# Patient Record
Sex: Male | Born: 1990 | Hispanic: No | Marital: Single | Smoking: Current every day smoker
Health system: Southern US, Community
[De-identification: ages and names within clinical notes are randomized; demographics above are authoritative.]

## PROBLEM LIST (undated history)

## (undated) ENCOUNTER — Emergency Department (HOSPITAL_COMMUNITY): Discharge status: Absent without leave | Payer: Self-pay

---

## 2013-03-16 ENCOUNTER — Ambulatory Visit
Admission: RE | Admit: 2013-03-16 | Discharge: 2013-03-16 | Disposition: A | Discharge status: Home / Self Care | Payer: Medicaid Other | Source: Ambulatory Visit | Attending: Family Medicine | Admitting: Family Medicine

## 2013-03-16 ENCOUNTER — Encounter: Payer: Self-pay | Admitting: Family Medicine

## 2013-03-16 ENCOUNTER — Ambulatory Visit: Payer: Medicaid Other | Attending: Family Medicine | Admitting: Family Medicine

## 2013-03-16 VITALS — BP 97/62 | HR 68 | Temp 97.9°F | Resp 18 | Ht 70.0 in | Wt 198.0 lb

## 2013-03-16 DIAGNOSIS — F17200 Nicotine dependence, unspecified, uncomplicated: Secondary | ICD-10-CM

## 2013-03-16 DIAGNOSIS — M549 Dorsalgia, unspecified: Secondary | ICD-10-CM | POA: Insufficient documentation

## 2013-03-16 DIAGNOSIS — M545 Low back pain: Secondary | ICD-10-CM

## 2013-03-16 MED ORDER — KETOROLAC TROMETHAMINE 30 MG/ML IJ SOLN
30.0000 mg | Freq: Once | INTRAMUSCULAR | Status: AC
  Administered 2013-03-16: 30 mg via INTRAMUSCULAR

## 2013-03-16 MED ORDER — NAPROXEN 500 MG PO TABS: ORAL_TABLET | ORAL | Status: DC

## 2013-03-16 MED ORDER — KETOROLAC TROMETHAMINE 30 MG/ML IM SOLN: 30.0000 mg | Freq: Once | INTRAMUSCULAR | Status: DC

## 2013-03-16 MED ORDER — CYCLOBENZAPRINE HCL 5 MG PO TABS: 5.0000 mg | ORAL_TABLET | Freq: Three times a day (TID) | ORAL | Status: DC | PRN

## 2013-03-16 NOTE — Patient Instructions (Signed)
Back Exercises °Back exercises help treat and prevent back injuries. The goal of back exercises is to increase the strength of your abdominal and back muscles and the flexibility of your back. These exercises should be started when you no longer have back pain. Back exercises include: °· Pelvic Tilt. Lie on your back with your knees bent. Tilt your pelvis until the lower part of your back is against the floor. Hold this position 5 to 10 sec and repeat 5 to 10 times. °· Knee to Chest. Pull first 1 knee up against your chest and hold for 20 to 30 seconds, repeat this with the other knee, and then both knees. This may be done with the other leg straight or bent, whichever feels better. °· Sit-Ups or Curl-Ups. Bend your knees 90 degrees. Start with tilting your pelvis, and do a partial, slow sit-up, lifting your trunk only 30 to 45 degrees off the floor. Take at least 2 to 3 seconds for each sit-up. Do not do sit-ups with your knees out straight. If partial sit-ups are difficult, simply do the above but with only tightening your abdominal muscles and holding it as directed. °· Hip-Lift. Lie on your back with your knees flexed 90 degrees. Push down with your feet and shoulders as you raise your hips a couple inches off the floor; hold for 10 seconds, repeat 5 to 10 times. °· Back arches. Lie on your stomach, propping yourself up on bent elbows. Slowly press on your hands, causing an arch in your low back. Repeat 3 to 5 times. Any initial stiffness and discomfort should lessen with repetition over time. °· Shoulder-Lifts. Lie face down with arms beside your body. Keep hips and torso pressed to floor as you slowly lift your head and shoulders off the floor. °Do not overdo your exercises, especially in the beginning. Exercises may cause you some mild back discomfort which lasts for a few minutes; however, if the pain is more severe, or lasts for more than 15 minutes, do not continue exercises until you see your caregiver.  Improvement with exercise therapy for back problems is slow.  °See your caregivers for assistance with developing a proper back exercise program. °Document Released: 10/09/2004 Document Revised: 11/24/2011 Document Reviewed: 07/03/2011 °ExitCare® Patient Information ©2014 [employer]. ° °Back Injury Prevention °Back injuries can be extremely painful and difficult to heal. After having one back injury, you are much more likely to experience another later on. It is important to learn how to avoid injuring or re-injuring your back. The following tips can help you to prevent a back injury. °PHYSICAL FITNESS °· Exercise regularly and try to develop good tone in your abdominal muscles. Your abdominal muscles provide a lot of the support needed by your back. °· Do aerobic exercises (walking, jogging, biking, swimming) regularly. °· Do exercises that increase balance and strength (tai chi, yoga) regularly. This can decrease your risk of falling and injuring your back. °· Stretch before and after exercising. °· Maintain a healthy weight. The more you weigh, the more stress is placed on your back. For every pound of weight, 10 times that amount of pressure is placed on the back. °DIET °· Talk to your caregiver about how much calcium and vitamin D you need per day. These nutrients help to prevent weakening of the bones (osteoporosis). Osteoporosis can cause broken (fractured) bones that lead to back pain. °· Include good sources of calcium in your diet, such as dairy products, green, leafy vegetables, and products with   calcium added (fortified). °· Include good sources of vitamin D in your diet, such as milk and foods that are fortified with vitamin D. °· Consider taking a nutritional supplement or a multivitamin if needed. °· Stop smoking if you smoke. °POSTURE °· Sit and stand up straight. Avoid leaning forward when you sit or hunching over when you stand. °· Choose chairs with good low back (lumbar) support. °· If you  work at a desk, sit close to your work so you do not need to lean over. Keep your chin tucked in. Keep your neck drawn back and elbows bent at a right angle. Your arms should look like the letter "L." °· Sit high and close to the steering wheel when you drive. Add a lumbar support to your car seat if needed. °· Avoid sitting or standing in one position for too long. Take breaks to get up, stretch, and walk around at least once every hour. Take breaks if you are driving for long periods of time. °· Sleep on your side with your knees slightly bent, or sleep on your back with a pillow under your knees. Do not sleep on your stomach. °LIFTING, TWISTING, AND REACHING °· Avoid heavy lifting, especially repetitive lifting. If you must do heavy lifting: °· Stretch before lifting. °· Work slowly. °· Rest between lifts. °· Use carts and dollies to move objects when possible. °· Make several small trips instead of carrying 1 heavy load. °· Ask for help when you need it. °· Ask for help when moving big, awkward objects. °· Follow these steps when lifting: °· Stand with your feet shoulder-width apart. °· Get as close to the object as you can. Do not try to pick up heavy objects that are far from your body. °· Use handles or lifting straps if they are available. °· Bend at your knees. Squat down, but keep your heels off the floor. °· Keep your shoulders pulled back, your chin tucked in, and your back straight. °· Lift the object slowly, tightening the muscles in your legs, abdomen, and buttocks. Keep the object as close to the center of your body as possible. °· When you put a load down, use these same guidelines in reverse. °· Do not: °· Lift the object above your waist. °· Twist at the waist while lifting or carrying a load. Move your feet if you need to turn, not your waist. °· Bend over without bending at your knees. °· Avoid reaching over your head, across a table, or for an object on a high surface. °OTHER TIPS °· Avoid wet  floors and keep sidewalks clear of ice to prevent falls. °· Do not sleep on a mattress that is too soft or too hard. °· Keep items that are used frequently within easy reach. °· Put heavier objects on shelves at waist level and lighter objects on lower or higher shelves. °· Find ways to decrease your stress, such as exercise, massage, or relaxation techniques. Stress can build up in your muscles. Tense muscles are more vulnerable to injury. °· Seek treatment for depression or anxiety if needed. These conditions can increase your risk of developing back pain. °SEEK MEDICAL CARE IF: °· You injure your back. °· You have questions about diet, exercise, or other ways to prevent back injuries. °MAKE SURE YOU: °· Understand these instructions. °· Will watch your condition. °· Will get help right away if you are not doing well or get worse. °Document Released: 10/09/2004 Document Revised: 11/24/2011 Document Reviewed:   ExitCare Patient Information 2014 [place], [place]. Back Pain, Adult Low back pain is very common. About 1 in 5 people have back pain.The cause of low back pain is rarely dangerous. The pain often gets better over time.About half of people with a sudden onset of back pain feel better in just 2 weeks. About 8 in 10 people feel better by 6 weeks.  CAUSES Some common causes of back pain include:  Strain of the muscles or ligaments supporting the spine.  Wear and tear (degeneration) of the spinal discs.  Arthritis.  Direct injury to the back. DIAGNOSIS Most of the time, the direct cause of low back pain is not known.However, back pain can be treated effectively even when the exact cause of the pain is unknown.Answering your caregiver's questions about your overall health and symptoms is one of the most accurate ways to make sure the cause of your pain is not dangerous. If your caregiver needs more information, he or she may order lab work or imaging tests (X-rays or MRIs).However,  even if imaging tests show changes in your back, this usually does not require surgery. HOME CARE INSTRUCTIONS For many people, back pain returns.Since low back pain is rarely dangerous, it is often a condition that people can learn to [facility] their own.   Remain active. It is stressful on the back to sit or stand in one place. Do not sit, drive, or stand in one place for more than 30 minutes at a time. Take short walks on level surfaces as soon as pain allows.Try to increase the length of time you walk each day.  Do not stay in bed.Resting more than 1 or 2 days can delay your recovery.  Do not avoid exercise or work.Your body is made to move.It is not dangerous to be active, even though your back may hurt.Your back will likely heal faster if you return to being active before your pain is gone.  Pay attention to your body when you bend and lift. Many people have less discomfortwhen lifting if they bend their knees, keep the load close to their bodies,and avoid twisting. Often, the most comfortable positions are those that put less stress on your recovering back.  Find a comfortable position to sleep. Use a firm mattress and lie on your side with your knees slightly bent. If you lie on your back, put a pillow under your knees.  Only take over-the-counter or prescription medicines as directed by your caregiver. Over-the-counter medicines to reduce pain and inflammation are often the most helpful.Your caregiver may prescribe muscle relaxant drugs.These medicines help dull your pain so you can more quickly return to your normal activities and healthy exercise.  Put ice on the injured area.  Put ice in a plastic bag.  Place a towel between your skin and the bag.  Leave the ice on for 15-20 minutes, 3-4 times a day for the first 2 to 3 days. After that, ice and heat may be alternated to reduce pain and spasms.  Ask your caregiver about trying back exercises and gentle massage. This may  be of some benefit.  Avoid feeling anxious or stressed.Stress increases muscle tension and can worsen back pain.It is important to recognize when you are anxious or stressed and learn ways to manage it.Exercise is a great option. SEEK MEDICAL CARE IF:  You have pain that is not relieved with rest or medicine.  You have pain that does not improve in 1 week.  You have new symptoms.  You are generally not feeling well. SEEK IMMEDIATE MEDICAL CARE IF:   You have pain that radiates from your back into your legs.  You develop new bowel or bladder control problems.  You have unusual weakness or numbness in your arms or legs.  You develop nausea or vomiting.  You develop abdominal pain.  You feel faint. Document Released: 09/01/2005 Document Revised: 03/02/2012 Document Reviewed: 01/20/2011 [facility] Patient Information 2014 [place], [place].

## 2013-03-16 NOTE — Progress Notes (Signed)
03/16/13 Present  to clinic c/o sprain in back [name]

## 2013-03-16 NOTE — Progress Notes (Signed)
Quick Note:  Print production planner will be needed to communicate with patient. You can use of language line and asked them to call the patient directly and do a three-way conversation with the patient. Please inform patient that the results of his x-ray of the lumbar spine came back within normal limits. No abnormalities were found.   Rodney Langton, MD, CDE, FAAFP [facility] [place], [place]   ______

## 2013-03-16 NOTE — Progress Notes (Signed)
Patient ID: Kirk Bailey, male   DOB: 1991-07-21, 22 y.o.   MRN: [id number]  CC:  Back pain ARabic interpreter used to communicate with patient today HPI: Pt presenting as a new patient to clinic today.  He reports that he's been lifting 50 pounds repetitively at work for the past several weeks and reports that he has developed pain in the lumbar spine.  The pain has not radiated.  It stays in the lower spine and has not caused any significant problems other than 6/10 pain.  The patient reports that he has not taken any medications for this.  The patient reports that he does not take any medications at all.  He denies any drug allergies.  The patient denies any other neurological symptoms.  The patient reports that 2 years ago he had a similar incident where he had back pain very similar to this current episode. No loss of bowel or bladder control.   Allergies no known allergies History reviewed. No pertinent past medical history. No current outpatient prescriptions on file prior to visit.   No current facility-administered medications on file prior to visit.   History reviewed. No pertinent family history. History   Social History  . Marital Status: Single    Spouse Name: N/A    Number of Children: N/A  . Years of Education: N/A   Occupational History  . Not on file.   Social History Main Topics  . Smoking status: Current Every Day Smoker    Types: Cigarettes  . Smokeless tobacco: Not on file     Comment: 1/2 pack per day  . Alcohol Use: Not on file  . Drug Use: Not on file  . Sexually Active: Not on file   Other Topics Concern  . Not on file   Social History Narrative  . No narrative on file    Review of Systems  Constitutional: Negative for fever, chills, diaphoresis, activity change, appetite change and fatigue.  HENT: Negative for ear pain, nosebleeds, congestion, facial swelling, rhinorrhea, neck pain, neck stiffness and ear discharge.   Eyes: Negative for pain,  discharge, redness, itching and visual disturbance.  Respiratory: Negative for cough, choking, chest tightness, shortness of breath, wheezing and stridor.   Cardiovascular: Negative for chest pain, palpitations and leg swelling.  Gastrointestinal: Negative for abdominal distention.  Genitourinary: Negative for dysuria, urgency, frequency, hematuria, flank pain, decreased urine volume, difficulty urinating and dyspareunia.  Musculoskeletal: lumbar spine pain   Neurological: Negative for dizziness, tremors, seizures, syncope, facial asymmetry, speech difficulty, weakness, light-headedness, numbness and headaches.  Hematological: Negative for adenopathy. Does not bruise/bleed easily.  Psychiatric/Behavioral: Negative for hallucinations, behavioral problems, confusion, dysphoric mood, decreased concentration and agitation.    Objective:   Filed Vitals:   03/16/13 1125  BP: 97/62  Pulse: 68  Temp: 97.9 F (36.6 C)  Resp: 18    Physical Exam  Constitutional: Appears well-developed and well-nourished. No distress.  HENT: Normocephalic. External right and left ear normal. Oropharynx is clear and moist.  Eyes: Conjunctivae and EOM are normal. PERRLA, no scleral icterus.  Neck: Normal ROM. Neck supple. No JVD. No tracheal deviation. No thyromegaly.  CVS: RRR, S1/S2 +, no murmurs, no gallops, no carotid bruit.  Pulmonary: Effort and breath sounds normal, no stridor, rhonchi, wheezes, rales.  Abdominal: Soft. BS +,  no distension, tenderness, rebound or guarding.  Musculoskeletal: Spasm of the paraspinal muscles of the lumbar spine, negative straight leg raise test  Lymphadenopathy: No lymphadenopathy noted, cervical, inguinal. Neuro:  Alert. Normal reflexes, muscle tone coordination. No cranial nerve deficit. Skin: Skin is warm and dry. No rash noted. Not diaphoretic. No erythema. No pallor.  Psychiatric: Normal mood and affect. Behavior, judgment, thought content normal.   No results found  for this basename: WBC, HGB, HCT, MCV, PLT   No results found for this basename: CREATININE, BUN, NA, K, CL, CO2    No results found for this basename: HGBA1C   Lipid Panel  No results found for this basename: chol, trig, hdl, cholhdl, vldl, ldlcalc     Assessment and plan:   Patient Active Problem List   Diagnosis Date Noted  . Acute low back pain 03/16/2013  . Smoker 03/16/2013   Send the patient for xray of lumbar spine toradol injection given in office. Rx for cyclobenzaprin 5 mg po tid prn spasms and for naproxen 500 mg po every 12 hours prn No lifting over 15 pounds for next week  The patient was counseled on the dangers of tobacco use, and was advised to quit.  Reviewed strategies to maximize success, including removing cigarettes and smoking materials from environment and stress management.  Follow up in 3 weeks for recheck  The patient was given clear instructions to go to ER or return to medical center if symptoms don't improve, worsen or new problems develop.  The patient verbalized understanding.  The patient was told to call to get any lab results if not heard anything in the next week.    Rodney Langton, MD, CDE, FAAFP [facility] [place], [place]

## 2013-04-06 ENCOUNTER — Ambulatory Visit: Payer: Medicaid Other | Attending: Family Medicine | Admitting: Family Medicine

## 2013-04-06 VITALS — BP 119/75 | HR 74 | Temp 99.0°F | Resp 16 | Wt 201.0 lb

## 2013-04-06 DIAGNOSIS — F17200 Nicotine dependence, unspecified, uncomplicated: Secondary | ICD-10-CM | POA: Insufficient documentation

## 2013-04-06 DIAGNOSIS — M545 Low back pain, unspecified: Secondary | ICD-10-CM | POA: Insufficient documentation

## 2013-04-06 MED ORDER — NAPROXEN 500 MG PO TABS: ORAL_TABLET | ORAL | Status: AC

## 2013-04-06 MED ORDER — CYCLOBENZAPRINE HCL 5 MG PO TABS: 5.0000 mg | ORAL_TABLET | Freq: Every evening | ORAL | Status: AC | PRN

## 2013-04-06 NOTE — Progress Notes (Signed)
Patient presents for follow up of back pain; states pain is better but pain gets very bad when sitting or standing for long periods.

## 2013-04-06 NOTE — Progress Notes (Signed)
CC: follow up   Interpreter used to speak with patient today  HPI: Pt is reporting that he is feeling better.  His back is not hurting like it had in the past. The flexeril is making him sleepy. He only has pain with sitting for prolonged periods of time.     No Known Allergies History reviewed. No pertinent past medical history. Current Outpatient Prescriptions on File Prior to Visit  Medication Sig Dispense Refill  . cyclobenzaprine (FLEXERIL) 5 MG tablet Take 1 tablet (5 mg total) by mouth 3 (three) times daily as needed for muscle spasms (Caution May Cause Drowsiness).  30 tablet  1  . naproxen (NAPROSYN) 500 MG tablet Arabic Instructions Please :  Take 1 tablet with food every 12 hours PRN back pain  20 tablet  0   No current facility-administered medications on file prior to visit.   History reviewed. No pertinent family history. History   Social History  . Marital Status: Single    Spouse Name: N/A    Number of Children: N/A  . Years of Education: N/A   Occupational History  . Not on file.   Social History Main Topics  . Smoking status: Current Every Day Smoker    Types: Cigarettes  . Smokeless tobacco: Not on file     Comment: 1/2 pack per day  . Alcohol Use: No  . Drug Use: No  . Sexually Active: Not on file   Other Topics Concern  . Not on file   Social History Narrative  . No narrative on file    Review of Systems  Constitutional: Negative for fever, chills, diaphoresis, activity change, appetite change and fatigue.  HENT: Negative for ear pain, nosebleeds, congestion, facial swelling, rhinorrhea, neck pain, neck stiffness and ear discharge.   Eyes: Negative for pain, discharge, redness, itching and visual disturbance.  Respiratory: Negative for cough, choking, chest tightness, shortness of breath, wheezing and stridor.   Cardiovascular: Negative for chest pain, palpitations and leg swelling.  Gastrointestinal: Negative for abdominal distention.   Genitourinary: Negative for dysuria, urgency, frequency, hematuria, flank pain, decreased urine volume, difficulty urinating and dyspareunia.  Musculoskeletal: improved back pain, Negative for joint swelling, arthralgias and gait problem.  Neurological: Negative for dizziness, tremors, seizures, syncope, facial asymmetry, speech difficulty, weakness, light-headedness, numbness and headaches.  Hematological: Negative for adenopathy. Does not bruise/bleed easily.  Psychiatric/Behavioral: Negative for hallucinations, behavioral problems, confusion, dysphoric mood, decreased concentration and agitation.    Objective:   Filed Vitals:   04/06/13 1054  BP: 119/75  Pulse: 74  Temp: 99 F (37.2 C)  Resp: 16    Physical Exam  Constitutional: Appears well-developed and well-nourished. No distress.  HENT: Normocephalic. External right and left ear normal. Oropharynx is clear and moist.  Eyes: Conjunctivae and EOM are normal. PERRLA, no scleral icterus.  Neck: Normal ROM. Neck supple. No JVD. No tracheal deviation. No thyromegaly.  CVS: RRR, S1/S2 +, no murmurs, no gallops, no carotid bruit.  Pulmonary: Effort and breath sounds normal, no stridor, rhonchi, wheezes, rales.  Abdominal: Soft. BS +,  no distension, tenderness, rebound or guarding.  Musculoskeletal: Normal range of motion. No edema and no tenderness.  Lymphadenopathy: No lymphadenopathy noted, cervical, inguinal. Neuro: Alert. Normal reflexes, muscle tone coordination. No cranial nerve deficit. Skin: Skin is warm and dry. No rash noted. Not diaphoretic. No erythema. No pallor.  Psychiatric: Normal mood and affect. Behavior, judgment, thought content normal.   No results found for this basename: WBC, HGB, HCT,  MCV, PLT   No results found for this basename: CREATININE, BUN, NA, K, CL, CO2    No results found for this basename: HGBA1C   Lipid Panel  No results found for this basename: chol, trig, hdl, cholhdl, vldl, ldlcalc        Assessment and plan:   Patient Active Problem List   Diagnosis Date Noted  . Acute low back pain 03/16/2013  . Smoker 03/16/2013   Pt says that pain is much better.  Pt is tolerating naproxen as needed for pain.   The patient was counseled on the dangers of tobacco use, and was advised to quit.  Reviewed strategies to maximize success, including removing cigarettes and smoking materials from environment.  Pt advised to use a lumbar support as needed when sitting for prolonged times  OK to return to work  Refill for naproxen to use as needed  The patient was given clear instructions to go to ER or return to medical center if symptoms don't improve, worsen or new problems develop.  The patient verbalized understanding.  The patient was told to call to get any lab results if not heard anything in the next week.    RTC in 6 months  Rodney Langton, MD, CDE, FAAFP [facility] [address] [place], [place]

## 2013-04-06 NOTE — Patient Instructions (Addendum)
Back Injury Prevention Back injuries can be extremely painful and difficult to heal. After having one back injury, you are much more likely to experience another later on. It is important to learn how to avoid injuring or re-injuring your back. The following tips can help you to prevent a back injury. PHYSICAL FITNESS  Exercise regularly and try to develop good tone in your abdominal muscles. Your abdominal muscles provide a lot of the support needed by your back.  Do aerobic exercises (walking, jogging, biking, swimming) regularly.  Do exercises that increase balance and strength (tai chi, yoga) regularly. This can decrease your risk of falling and injuring your back.  Stretch before and after exercising.  Maintain a healthy weight. The more you weigh, the more stress is placed on your back. For every pound of weight, 10 times that amount of pressure is placed on the back. DIET  Talk to your caregiver about how much calcium and vitamin D you need per day. These nutrients help to prevent weakening of the bones (osteoporosis). Osteoporosis can cause broken (fractured) bones that lead to back pain.  Include good sources of calcium in your diet, such as dairy products, green, leafy vegetables, and products with calcium added (fortified).  Include good sources of vitamin D in your diet, such as milk and foods that are fortified with vitamin D.  Consider taking a nutritional supplement or a multivitamin if needed.  Stop smoking if you smoke. POSTURE  Sit and stand up straight. Avoid leaning forward when you sit or hunching over when you stand.  Choose chairs with good low back (lumbar) support.  If you work at a desk, sit close to your work so you do not need to lean over. Keep your chin tucked in. Keep your neck drawn back and elbows bent at a right angle. Your arms should look like the letter "L."  Sit high and close to the steering wheel when you drive. Add a lumbar support to your car  seat if needed.  Avoid sitting or standing in one position for too long. Take breaks to get up, stretch, and walk around at least once every hour. Take breaks if you are driving for long periods of time.  Sleep on your side with your knees slightly bent, or sleep on your back with a pillow under your knees. Do not sleep on your stomach. LIFTING, TWISTING, AND REACHING  Avoid heavy lifting, especially repetitive lifting. If you must do heavy lifting:  Stretch before lifting.  Work slowly.  Rest between lifts.  Use carts and dollies to move objects when possible.  Make several small trips instead of carrying 1 heavy load.  Ask for help when you need it.  Ask for help when moving big, awkward objects.  Follow these steps when lifting:  Stand with your feet shoulder-width apart.  Get as close to the object as you can. Do not try to pick up heavy objects that are far from your body.  Use handles or lifting straps if they are available.  Bend at your knees. Squat down, but keep your heels off the floor.  Keep your shoulders pulled back, your chin tucked in, and your back straight.  Lift the object slowly, tightening the muscles in your legs, abdomen, and buttocks. Keep the object as close to the center of your body as possible.  When you put a load down, use these same guidelines in reverse.  Do not:  Lift the object above your waist.    Twist at the waist while lifting or carrying a load. Move your feet if you need to turn, not your waist.  Bend over without bending at your knees.  Avoid reaching over your head, across a table, or for an object on a high surface. OTHER TIPS  Avoid wet floors and keep sidewalks clear of ice to prevent falls.  Do not sleep on a mattress that is too soft or too hard.  Keep items that are used frequently within easy reach.  Put heavier objects on shelves at waist level and lighter objects on lower or higher shelves.  Find ways to  decrease your stress, such as exercise, massage, or relaxation techniques. Stress can build up in your muscles. Tense muscles are more vulnerable to injury.  Seek treatment for depression or anxiety if needed. These conditions can increase your risk of developing back pain. SEEK MEDICAL CARE IF:  You injure your back.  You have questions about diet, exercise, or other ways to prevent back injuries. MAKE SURE YOU:  Understand these instructions.  Will watch your condition.  Will get help right away if you are not doing well or get worse. Document Released: 10/09/2004 Document Revised: 11/24/2011 Document Reviewed: 10/13/2011 [facility] Patient Information 2014 [place], [place].  Smoking Cessation, Tips for Success YOU CAN QUIT SMOKING If you are ready to quit smoking, congratulations! You have chosen to help yourself be healthier. Cigarettes bring nicotine, tar, carbon monoxide, and other irritants into your body. Your lungs, heart, and blood vessels will be able to work better without these poisons. There are many different ways to quit smoking. Nicotine gum, nicotine patches, a nicotine inhaler, or nicotine nasal spray can help with physical craving. Hypnosis, support groups, and medicines help break the habit of smoking. Here are some tips to help you quit for good.  Throw away all cigarettes.  Clean and remove all ashtrays from your home, work, and car.  On a card, write down your reasons for quitting. Carry the card with you and read it when you get the urge to smoke.  Cleanse your body of nicotine. Drink enough water and fluids to keep your urine clear or pale yellow. Do this after quitting to flush the nicotine from your body.  Learn to predict your moods. Do not let a bad situation be your excuse to have a cigarette. Some situations in your life might tempt you into wanting a cigarette.  Never have "just one" cigarette. It leads to wanting another and another. Remind yourself of  your decision to quit.  Change habits associated with smoking. If you smoked while driving or when feeling stressed, try other activities to replace smoking. Stand up when drinking your coffee. Brush your teeth after eating. Sit in a different chair when you read the paper. Avoid alcohol while trying to quit, and try to drink fewer caffeinated beverages. Alcohol and caffeine may urge you to smoke.  Avoid foods and drinks that can trigger a desire to smoke, such as sugary or spicy foods and alcohol.  Ask people who smoke not to smoke around you.  Have something planned to do right after eating or having a cup of coffee. Take a walk or exercise to perk you up. This will help to keep you from overeating.  Try a relaxation exercise to calm you down and decrease your stress. Remember, you may be tense and nervous for the first 2 weeks after you quit, but this will pass.  Find new activities to keep  your hands busy. Play with a pen, coin, or rubber band. Doodle or draw things on paper.  Brush your teeth right after eating. This will help cut down on the craving for the taste of tobacco after meals. You can try mouthwash, too.  Use oral substitutes, such as lemon drops, carrots, a cinnamon stick, or chewing gum, in place of cigarettes. Keep them handy so they are available when you have the urge to smoke.  When you have the urge to smoke, try deep breathing.  Designate your home as a nonsmoking area.  If you are a heavy smoker, ask your caregiver about a prescription for nicotine chewing gum. It can ease your withdrawal from nicotine.  Reward yourself. Set aside the cigarette money you save and buy yourself something nice.  Look for support from others. Join a support group or smoking cessation program. Ask someone at home or at work to help you with your plan to quit smoking.  Always ask yourself, "Do I need this cigarette or is this just a reflex?" Tell yourself, "Today, I choose not to  smoke," or "I do not want to smoke." You are reminding yourself of your decision to quit, even if you do smoke a cigarette. HOW WILL I FEEL WHEN I QUIT SMOKING?  The benefits of not smoking start within days of quitting.  You may have symptoms of withdrawal because your body is used to nicotine (the addictive substance in cigarettes). You may crave cigarettes, be irritable, feel very hungry, cough often, get headaches, or have difficulty concentrating.  The withdrawal symptoms are only temporary. They are strongest when you first quit but will go away within 10 to 14 days.  When withdrawal symptoms occur, stay in control. Think about your reasons for quitting. Remind yourself that these are signs that your body is healing and getting used to being without cigarettes.  Remember that withdrawal symptoms are easier to treat than the major diseases that smoking can cause.  Even after the withdrawal is over, expect periodic urges to smoke. However, these cravings are generally short-lived and will go away whether you smoke or not. Do not smoke!  If you relapse and smoke again, do not lose hope. Most smokers quit 3 times before they are successful.  If you relapse, do not give up! Plan ahead and think about what you will do the next time you get the urge to smoke. LIFE AS A NONSMOKER: MAKE IT FOR A MONTH, MAKE IT FOR LIFE Day 1: Hang this page where you will see it every day. Day 2: Get rid of all ashtrays, matches, and lighters. Day 3: Drink water. Breathe deeply between sips. Day 4: Avoid places with smoke-filled air, such as bars, clubs, or the smoking section of restaurants. Day 5: Keep track of how much money you save by not smoking. Day 6: Avoid boredom. Keep a good book with you or go to the movies. Day 7: Reward yourself! One week without smoking! Day 8: Make a dental appointment to get your teeth cleaned. Day 9: Decide how you will turn down a cigarette before it is offered to you. Day  10: Review your reasons for quitting. Day 11: Distract yourself. Stay active to keep your mind off smoking and to relieve tension. Take a walk, exercise, read a book, do a crossword puzzle, or try a new hobby. Day 12: Exercise. Get off the bus before your stop or use stairs instead of escalators. Day 13: Call on friends for  support and encouragement. Day 14: Reward yourself! Two weeks without smoking! Day 15: Practice deep breathing exercises. Day 16: Bet a friend that you can stay a nonsmoker. Day 17: Ask to sit in nonsmoking sections of restaurants. Day 18: Hang up "No Smoking" signs. Day 19: Think of yourself as a nonsmoker. Day 20: Each morning, tell yourself you will not smoke. Day 21: Reward yourself! Three weeks without smoking! Day 22: Think of smoking in negative ways. Remember how it stains your teeth, gives you bad breath, and leaves you short of breath. Day 23: Eat a nutritious breakfast. Day 24:Do not relive your days as a smoker. Day 25: Hold a pencil in your hand when talking on the telephone. Day 26: Tell all your friends you do not smoke. Day 27: Think about how much better food tastes. Day 28: Remember, one cigarette is one too many. Day 29: Take up a hobby that will keep your hands busy. Day 30: Congratulations! One month without smoking! Give yourself a big reward. Your caregiver can direct you to community resources or hospitals for support, which may include:  Group support.  Education.  Hypnosis.  Subliminal therapy. Document Released: 05/30/2004 Document Revised: 11/24/2011 Document Reviewed: 06/18/2009 [facility] Patient Information 2014 [place], [place].

## 2013-07-01 ENCOUNTER — Ambulatory Visit: Payer: Medicaid Other

## 2013-07-12 ENCOUNTER — Ambulatory Visit: Payer: Medicaid Other

## 2013-07-14 ENCOUNTER — Ambulatory Visit (HOSPITAL_COMMUNITY)
Admission: RE | Admit: 2013-07-14 | Discharge: 2013-07-14 | Disposition: A | Discharge status: Home / Self Care | Payer: Medicaid Other | Source: Ambulatory Visit | Attending: Internal Medicine | Admitting: Internal Medicine

## 2013-07-14 ENCOUNTER — Ambulatory Visit: Payer: Medicaid Other | Attending: Internal Medicine | Admitting: Internal Medicine

## 2013-07-14 DIAGNOSIS — M25439 Effusion, unspecified wrist: Secondary | ICD-10-CM | POA: Insufficient documentation

## 2013-07-14 DIAGNOSIS — M25532 Pain in left wrist: Secondary | ICD-10-CM

## 2013-07-14 DIAGNOSIS — M25539 Pain in unspecified wrist: Secondary | ICD-10-CM | POA: Insufficient documentation

## 2013-07-14 MED ORDER — UNABLE TO FIND: Status: AC

## 2013-07-14 NOTE — Progress Notes (Unsigned)
Patient ID: Kirk Bailey, male   DOB: [birth year], 22 y.o.   MRN: [id number] Patient Demographics  Kirk Bailey, is a 22 y.o. male  WUJ:[id number]  NFA:[id number]  DOB - March 29, 1991  No chief complaint on file.       Subjective:   Kirk Bailey today is here for a follow up visit. [name] here with interpreter. Patient reports that he came to [place] 7 months ago. He had  Left hand injury when he fell onto his left side while playing soccer in [place]. Since then it has been hurting especially on lifting weight. 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 Patient has No headache, No chest pain, No abdominal pain - No Nausea, No new weakness tingling or numbness, No Cough - SOB.  Objective:    There were no vitals filed for this visit.   ALLERGIES:  No Known Allergies  PAST MEDICAL HISTORY: No past medical history on file.  MEDICATIONS AT HOME: Prior to Admission medications   Medication Sig Start Date End Date Taking? Authorizing Provider  cyclobenzaprine (FLEXERIL) 5 MG tablet Take 1 tablet (5 mg total) by mouth at bedtime as needed for muscle spasms (Caution May Cause Drowsiness). 04/06/13   Clanford Cyndie Mull, MD  naproxen (NAPROSYN) 500 MG tablet Arabic Instructions Please :  Take 1 tablet with food every 12 hours PRN back pain 04/06/13   Clanford Cyndie Mull, MD  UNABLE TO FIND LEFT WRIST SPLINT 07/14/13   Ripudeep Jenna Luo, MD     Exam  General appearance :Awake, alert, NAD, Speech Clear.  HEENT: Atraumatic and Normocephalic, PERLA Neck: supple, no JVD. No cervical lymphadenopathy.  Chest: Clear to auscultation bilaterally, no wheezing, rales or rhonchi CVS: S1 S2 regular, no murmurs.  Abdomen: soft, NBS, NT, ND, no gaurding, rigidity or rebound. Extremities: no cyanosis or clubbing, B/L Lower Ext shows no edema Left wrist examined: Strength 5/ 5, 2 wrist bones protruding but no  tenderness, ROM normal   Neurology: Awake alert, and oriented X 3, CN II-XII intact, Non focal Skin: No Rash or lesions Wounds:N/A    Data Review   Basic Metabolic Panel: No results found for this basename: NA, K, CL, CO2, GLUCOSE, BUN, CREATININE, CALCIUM, MG, PHOS,  in the last 168 hours Liver Function Tests: No results found for this basename: AST, ALT, ALKPHOS, BILITOT, PROT, ALBUMIN,  in the last 168 hours  CBC: No results found for this basename: WBC, NEUTROABS, HGB, HCT, MCV, PLT,  in the last 168 hours  ------------------------------------------------------------------------------------------------------------------ No results found for this basename: HGBA1C,  in the last 72 hours ------------------------------------------------------------------------------------------------------------------ No results found for this basename: CHOL, HDL, LDLCALC, TRIG, CHOLHDL, LDLDIRECT,  in the last 72 hours ------------------------------------------------------------------------------------------------------------------ No results found for this basename: TSH, T4TOTAL, FREET3, T3FREE, THYROIDAB,  in the last 72 hours ------------------------------------------------------------------------------------------------------------------ No results found for this basename: VITAMINB12, FOLATE, FERRITIN, TIBC, IRON, RETICCTPCT,  in the last 72 hours  Coagulation profile  No results found for this basename: INR, PROTIME,  in the last 168 hours    Assessment & Plan   Active Problems: 1. Left wrist pain/ discomfort - Complete Left wrist Xray, pending results, may need orthopedic referral - does not want any pain medications - recommended patient to try a wrist splint.   health screening -Patient declined flu shot  Follow-up in1 month after the wrist x-ray     RAI,RIPUDEEP M.D. 07/14/2013, 3:27  PM   

## 2013-08-18 ENCOUNTER — Encounter: Payer: Self-pay | Admitting: Internal Medicine

## 2013-08-18 ENCOUNTER — Ambulatory Visit: Payer: Self-pay | Attending: Internal Medicine | Admitting: Internal Medicine

## 2013-08-18 VITALS — BP 124/82 | HR 87 | Temp 99.4°F | Resp 16 | Ht 70.0 in | Wt 206.0 lb

## 2013-08-18 DIAGNOSIS — M67439 Ganglion, unspecified wrist: Secondary | ICD-10-CM | POA: Insufficient documentation

## 2013-08-18 DIAGNOSIS — M6740 Ganglion, unspecified site: Secondary | ICD-10-CM | POA: Insufficient documentation

## 2013-08-18 DIAGNOSIS — M67432 Ganglion, left wrist: Secondary | ICD-10-CM

## 2013-08-18 MED ORDER — DICLOFENAC SODIUM 1 % TD GEL: 2.0000 g | Freq: Four times a day (QID) | TRANSDERMAL | Status: AC

## 2013-08-18 NOTE — Patient Instructions (Signed)
Ganglion Cyst °A ganglion cyst is a noncancerous, fluid-filled lump that occurs near joints or tendons. The ganglion cyst grows out of a joint or the lining of a tendon. It most often develops in the hand or wrist but can also develop in the shoulder, elbow, hip, knee, ankle, or foot. The round or oval ganglion can be pea sized or larger than a grape. Increased activity may enlarge the size of the cyst because more fluid starts to build up.  °CAUSES  °It is not completely known what causes a ganglion cyst to grow. However, it may be related to: °· Inflammation or irritation around the joint. °· An injury. °· Repetitive movements or overuse. °· Arthritis. °SYMPTOMS  °A lump most often appears in the hand or wrist, but can occur in other areas of the body. Generally, the lump is painless without other symptoms. However, sometimes pain can be felt during activity or when pressure is applied to the lump. The lump may even be tender to the touch. Tingling, pain, numbness, or muscle weakness can occur if the ganglion cyst presses on a nerve. Your grip may be weak and you may have less movement in your joints.  °DIAGNOSIS  °Ganglion cysts are most often diagnosed based on a physical exam, noting where the cyst is and how it looks. Your caregiver will feel the lump and may shine a light alongside it. If it is a ganglion, a light often shines through it. Your caregiver may order an X-ray, ultrasound, or MRI to rule out other conditions. °TREATMENT  °Ganglions usually go away on their own without treatment. If pain or other symptoms are involved, treatment may be needed. Treatment is also needed if the ganglion limits your movement or if it gets infected. Treatment options include: °· Wearing a wrist or finger brace or splint. °· Taking anti-inflammatory medicine. °· Draining fluid from the lump with a needle (aspiration). °· Injecting a steroid into the joint. °· Surgery to remove the ganglion cyst and its stalk that is  attached to the joint or tendon. However, ganglion cysts can grow back. °HOME CARE INSTRUCTIONS  °· Do not press on the ganglion, poke it with a needle, or hit it with a heavy object. You may rub the lump gently and often. Sometimes fluid moves out of the cyst. °· Only take medicines as directed by your caregiver. °· Wear your brace or splint as directed by your caregiver. °SEEK MEDICAL CARE IF:  °· Your ganglion becomes larger or more painful. °· You have increased redness, red streaks, or swelling. °· You have pus coming from the lump. °· You have weakness or numbness in the affected area. °MAKE SURE YOU:  °· Understand these instructions. °· Will watch your condition. °· Will get help right away if you are not doing well or get worse. °Document Released: 08/29/2000 Document Revised: 05/26/2012 Document Reviewed: 10/26/2007 °[employer]® Patient Information ©2014 [employer]. ° °

## 2013-08-18 NOTE — Progress Notes (Signed)
Patient ID: Kirk Bailey, male   DOB: 01/17/1991, 22 y.o.   MRN: [id number] Patient Demographics  Daichi Moris, is a 22 y.o. male  WUJ:[id number]  NFA:[id number]  DOB - [birth year]  Chief Complaint  Patient presents with  . Follow-up        Subjective:   Kirk Bailey is a 22 y.o. male here today for a follow up visit. Patient still having pain of the left wrist, note is that the bump is more prominent, hurts more when he drives. No redness, no fever, no diffuse joint swelling. Patient smokes cigarettes about 1 pack per day. He does not drink alcohol Patient has No headache, No chest pain, No abdominal pain - No Nausea, No new weakness tingling or numbness, No Cough - SOB.  ALLERGIES: No Known Allergies  PAST MEDICAL HISTORY: History reviewed. No pertinent past medical history.  MEDICATIONS AT HOME: Prior to Admission medications   Medication Sig Start Date End Date Taking? Authorizing Provider  cyclobenzaprine (FLEXERIL) 5 MG tablet Take 1 tablet (5 mg total) by mouth at bedtime as needed for muscle spasms (Caution May Cause Drowsiness). 04/06/13   Clanford Cyndie Mull, MD  diclofenac sodium (VOLTAREN) 1 % GEL Apply 2 g topically 4 (four) times daily. 08/18/13   Jeanann Lewandowsky, MD  naproxen (NAPROSYN) 500 MG tablet Arabic Instructions Please :  Take 1 tablet with food every 12 hours PRN back pain 04/06/13   Clanford Cyndie Mull, MD  UNABLE TO FIND LEFT WRIST SPLINT 07/14/13   Ripudeep Jenna Luo, MD     Objective:   Filed Vitals:   08/18/13 1426  BP: 124/82  Pulse: 87  Temp: 99.4 F (37.4 C)  TempSrc: Oral  Resp: 16  Height: 5\' 10"  (1.778 m)  Weight: 206 lb (93.441 kg)  SpO2: 98%    Exam General appearance : Awake, alert, not in any distress. Speech Clear. Not toxic looking HEENT: Atraumatic and Normocephalic, pupils equally reactive to light and accomodation Neck: supple, no JVD. No cervical lymphadenopathy.  Chest:Good air entry bilaterally, no added sounds   CVS: S1 S2 regular, no murmurs.  Abdomen: Bowel sounds present, Non tender and not distended with no gaurding, rigidity or rebound. Extremities: Ganglion cyst of left wrist joint. B/L Lower Ext shows no edema, both legs are warm to touch Neurology: Awake alert, and oriented X 3, CN II-XII intact, Non focal Skin:No Rash Wounds:N/A   Data Review   CBC No results found for this basename: WBC, HGB, HCT, PLT, MCV, MCH, MCHC, RDW, NEUTRABS, LYMPHSABS, MONOABS, EOSABS, BASOSABS, BANDABS, BANDSABD,  in the last 168 hours  Chemistries   No results found for this basename: NA, K, CL, CO2, GLUCOSE, BUN, CREATININE, GFRCGP, CALCIUM, MG, AST, ALT, ALKPHOS, BILITOT,  in the last 168 hours ------------------------------------------------------------------------------------------------------------------ No results found for this basename: HGBA1C,  in the last 72 hours ------------------------------------------------------------------------------------------------------------------ No results found for this basename: CHOL, HDL, LDLCALC, TRIG, CHOLHDL, LDLDIRECT,  in the last 72 hours ------------------------------------------------------------------------------------------------------------------ No results found for this basename: TSH, T4TOTAL, FREET3, T3FREE, THYROIDAB,  in the last 72 hours ------------------------------------------------------------------------------------------------------------------ No results found for this basename: VITAMINB12, FOLATE, FERRITIN, TIBC, IRON, RETICCTPCT,  in the last 72 hours  Coagulation profile  No results found for this basename: INR, PROTIME,  in the last 168 hours    Assessment & Plan   1. Ganglion cyst of wrist, left Conservative management Vs surgical excision Patient was counseled extensively about options available. He chose to go with conservative management for now -  diclofenac sodium (VOLTAREN) 1 % GEL; Apply 2 g topically 4 (four) times  daily.  Dispense: 1 Tube; Refill: 2  Follow up in 6 months or when necessary   The patient was given clear instructions to go to ER or return to medical center if symptoms don't improve, worsen or new problems develop. The patient verbalized understanding. The patient was told to call to get lab results if they haven't heard anything in the next week.    Jeanann Lewandowsky, MD, MHA, FACP, FAAP [facility] [place], [place] [phone]   08/18/2013, 2:46 PM

## 2013-08-18 NOTE — Progress Notes (Signed)
Pt is here following up in his left wrist pain. Pt had x rays taking of his wrist and would like to review the results

## 2013-08-30 ENCOUNTER — Ambulatory Visit: Payer: Self-pay | Attending: Internal Medicine

## 2013-10-06 ENCOUNTER — Ambulatory Visit: Payer: Medicaid Other

## 2014-03-06 ENCOUNTER — Ambulatory Visit: Payer: Medicaid Other

## 2021-12-31 ENCOUNTER — Emergency Department (HOSPITAL_BASED_OUTPATIENT_CLINIC_OR_DEPARTMENT_OTHER): Payer: Medicaid Other

## 2021-12-31 ENCOUNTER — Other Ambulatory Visit: Payer: Self-pay | Admitting: General Surgery

## 2021-12-31 ENCOUNTER — Ambulatory Visit (HOSPITAL_BASED_OUTPATIENT_CLINIC_OR_DEPARTMENT_OTHER)
Admission: EM | Admit: 2021-12-31 | Discharge: 2022-01-01 | Disposition: A | Discharge status: Home / Self Care | Payer: Medicaid Other | Attending: General Surgery | Admitting: General Surgery

## 2021-12-31 ENCOUNTER — Other Ambulatory Visit: Payer: Self-pay

## 2021-12-31 ENCOUNTER — Encounter (HOSPITAL_BASED_OUTPATIENT_CLINIC_OR_DEPARTMENT_OTHER): Payer: Self-pay | Admitting: Emergency Medicine

## 2021-12-31 DIAGNOSIS — K37 Unspecified appendicitis: Secondary | ICD-10-CM | POA: Diagnosis present

## 2021-12-31 DIAGNOSIS — K3580 Unspecified acute appendicitis: Secondary | ICD-10-CM | POA: Diagnosis not present

## 2021-12-31 DIAGNOSIS — K353 Acute appendicitis with localized peritonitis, without perforation or gangrene: Secondary | ICD-10-CM

## 2021-12-31 DIAGNOSIS — F17200 Nicotine dependence, unspecified, uncomplicated: Secondary | ICD-10-CM | POA: Diagnosis not present

## 2021-12-31 DIAGNOSIS — Z789 Other specified health status: Secondary | ICD-10-CM

## 2021-12-31 LAB — URINALYSIS, ROUTINE W REFLEX MICROSCOPIC
Bilirubin Urine: NEGATIVE
Glucose, UA: NEGATIVE mg/dL
Ketones, ur: NEGATIVE mg/dL
Leukocytes,Ua: NEGATIVE
Nitrite: NEGATIVE
Protein, ur: NEGATIVE mg/dL
Specific Gravity, Urine: 1.025 (ref 1.005–1.030)
pH: 6 (ref 5.0–8.0)

## 2021-12-31 LAB — CBC WITH DIFFERENTIAL/PLATELET
Abs Immature Granulocytes: 0.13 10*3/uL — ABNORMAL HIGH (ref 0.00–0.07)
Basophils Absolute: 0.1 10*3/uL (ref 0.0–0.1)
Basophils Relative: 0 %
Eosinophils Absolute: 0.1 10*3/uL (ref 0.0–0.5)
Eosinophils Relative: 0 %
HCT: 39.7 % (ref 39.0–52.0)
Hemoglobin: 13.8 g/dL (ref 13.0–17.0)
Immature Granulocytes: 1 %
Lymphocytes Relative: 10 %
Lymphs Abs: 2.1 10*3/uL (ref 0.7–4.0)
MCH: 30.1 pg (ref 26.0–34.0)
MCHC: 34.8 g/dL (ref 30.0–36.0)
MCV: 86.7 fL (ref 80.0–100.0)
Monocytes Absolute: 0.9 10*3/uL (ref 0.1–1.0)
Monocytes Relative: 4 %
Neutro Abs: 17.4 10*3/uL — ABNORMAL HIGH (ref 1.7–7.7)
Neutrophils Relative %: 85 %
Platelets: 264 10*3/uL (ref 150–400)
RBC: 4.58 MIL/uL (ref 4.22–5.81)
RDW: 13 % (ref 11.5–15.5)
WBC: 20.7 10*3/uL — ABNORMAL HIGH (ref 4.0–10.5)
nRBC: 0 % (ref 0.0–0.2)

## 2021-12-31 LAB — URINALYSIS, MICROSCOPIC (REFLEX)

## 2021-12-31 LAB — COMPREHENSIVE METABOLIC PANEL
ALT: 17 U/L (ref 0–44)
AST: 16 U/L (ref 15–41)
Albumin: 4.3 g/dL (ref 3.5–5.0)
Alkaline Phosphatase: 58 U/L (ref 38–126)
Anion gap: 8 (ref 5–15)
BUN: 13 mg/dL (ref 6–20)
CO2: 21 mmol/L — ABNORMAL LOW (ref 22–32)
Calcium: 9.2 mg/dL (ref 8.9–10.3)
Chloride: 109 mmol/L (ref 98–111)
Creatinine, Ser: 1.07 mg/dL (ref 0.61–1.24)
GFR, Estimated: >60 mL/min (ref >60)
Glucose, Bld: 112 mg/dL — ABNORMAL HIGH (ref 70–99)
Potassium: 3.7 mmol/L (ref 3.5–5.1)
Sodium: 138 mmol/L (ref 135–145)
Total Bilirubin: 0.8 mg/dL (ref 0.3–1.2)
Total Protein: 7.5 g/dL (ref 6.5–8.1)

## 2021-12-31 LAB — LIPASE, BLOOD: Lipase: 27 U/L (ref 11–51)

## 2021-12-31 MED ORDER — SODIUM CHLORIDE 0.9 % IV BOLUS
1000.0000 mL | Freq: Once | INTRAVENOUS | Status: AC
  Administered 2021-12-31: 1000 mL via INTRAVENOUS

## 2021-12-31 MED ORDER — SODIUM CHLORIDE 0.9 % IV SOLN
2.0000 g | Freq: Once | INTRAVENOUS | Status: AC
  Administered 2021-12-31: 2 g via INTRAVENOUS
  Filled 2021-12-31: qty 20

## 2021-12-31 MED ORDER — MORPHINE SULFATE (PF) 4 MG/ML IV SOLN
4.0000 mg | Freq: Once | INTRAVENOUS | Status: AC
  Administered 2021-12-31: 4 mg via INTRAVENOUS
  Filled 2021-12-31: qty 1

## 2021-12-31 MED ORDER — METRONIDAZOLE 500 MG/100ML IV SOLN
500.0000 mg | Freq: Once | INTRAVENOUS | Status: AC
  Administered 2021-12-31: 500 mg via INTRAVENOUS
  Filled 2021-12-31: qty 100

## 2021-12-31 MED ORDER — IOHEXOL 300 MG/ML  SOLN
100.0000 mL | Freq: Once | INTRAMUSCULAR | Status: AC | PRN
  Administered 2021-12-31: 100 mL via INTRAVENOUS

## 2021-12-31 NOTE — ED Notes (Signed)
Patient transported to CT 

## 2021-12-31 NOTE — ED Triage Notes (Signed)
Pt reports abdominal pain with nausea that started at 0200 this morning. Pt states he has been constipated and trouble urinating.  ?

## 2021-12-31 NOTE — ED Notes (Signed)
Pt provided with specimen cup but unable to urinate at this time.  °

## 2021-12-31 NOTE — H&P (Incomplete)
Kirk Bailey is an 31 y.o. male.   ?Chief Complaint: Abdominal pain ? ?HPI:  ?Pt is a [age] yo M who [facility] to [facility] ED with worsening abdominal pain over the day and n/v.  He denies sick contacts.  He has had some constipation and difficulty urinating.  He hasn't had a big appetite today, but has been able to tolerate water.  He denies fever/chills.   ? ?History reviewed. No pertinent past medical history. ? ?History reviewed. No pertinent surgical history. ? ?History reviewed. No pertinent family history. ?Social History:  reports that he has been smoking cigarettes. He does not have any smokeless tobacco history on file. He reports that he does not drink alcohol and does not use drugs. ? ?Allergies: No Known Allergies ? ?Medications: ?No outpatient medications have been marked as taking for the 12/31/21 encounter [facility](Hospital Encounter).  ? ? ? ?Results for orders placed or performed during the hospital encounter of 12/31/21 (from the past 48 hour(s))  ?CBC with Differential     Status: Abnormal  ? Collection Time: 12/31/21  2:36 PM  ?Result Value Ref Range  ? WBC 20.7 (H) 4.0 - 10.5 K/uL  ? RBC 4.58 4.22 - 5.81 MIL/uL  ? Hemoglobin 13.8 13.0 - 17.0 g/dL  ? HCT 39.7 39.0 - 52.0 %  ? MCV 86.7 80.0 - 100.0 fL  ? MCH 30.1 26.0 - 34.0 pg  ? MCHC 34.8 30.0 - 36.0 g/dL  ? RDW 13.0 11.5 - 15.5 %  ? Platelets 264 150 - 400 K/uL  ? nRBC 0.0 0.0 - 0.2 %  ? Neutrophils Relative % 85 %  ? Neutro Abs 17.4 (H) 1.7 - 7.7 K/uL  ? Lymphocytes Relative 10 %  ? Lymphs Abs 2.1 0.7 - 4.0 K/uL  ? Monocytes Relative 4 %  ? Monocytes Absolute 0.9 0.1 - 1.0 K/uL  ? Eosinophils Relative 0 %  ? Eosinophils Absolute 0.1 0.0 - 0.5 K/uL  ? Basophils Relative 0 %  ? Basophils Absolute 0.1 0.0 - 0.1 K/uL  ? Immature Granulocytes 1 %  ? Abs Immature Granulocytes 0.13 (H) 0.00 - 0.07 K/uL  ?  Comment: Performed at [facility] [place], [address] [place], [place] [address]  ?Comprehensive metabolic panel     Status: Abnormal  ? Collection  Time: 12/31/21  2:36 PM  ?Result Value Ref Range  ? Sodium 138 135 - 145 mmol/L  ? Potassium 3.7 3.5 - 5.1 mmol/L  ? Chloride 109 98 - 111 mmol/L  ? CO2 21 (L) 22 - 32 mmol/L  ? Glucose, Bld 112 (H) 70 - 99 mg/dL  ?  Comment: Glucose reference range applies only to samples taken after fasting for at least 8 hours.  ? BUN 13 6 - 20 mg/dL  ? Creatinine, Ser 1.07 0.61 - 1.24 mg/dL  ? Calcium 9.2 8.9 - 10.3 mg/dL  ? Total Protein 7.5 6.5 - 8.1 g/dL  ? Albumin 4.3 3.5 - 5.0 g/dL  ? AST 16 15 - 41 U/L  ? ALT 17 0 - 44 U/L  ? Alkaline Phosphatase 58 38 - 126 U/L  ? Total Bilirubin 0.8 0.3 - 1.2 mg/dL  ? GFR, Estimated >60 >60 mL/min  ?  Comment: (NOTE) ?Calculated using the CKD-EPI Creatinine Equation (2021) ?  ? Anion gap 8 5 - 15  ?  Comment: Performed at [facility] [place], [address] [place], [place] [address]  ?Lipase, blood  Status: None  ? Collection Time: 12/31/21  2:36 PM  ?Result Value Ref Range  ? Lipase 27 11 - 51 U/L  ?  Comment: Performed at [facility], [address], [place], [place] [address]  ?Urinalysis, Routine w reflex microscopic Urine, Clean Catch     Status: Abnormal  ? Collection Time: 12/31/21  2:36 PM  ?Result Value Ref Range  ? Color, Urine YELLOW YELLOW  ? APPearance CLEAR CLEAR  ? Specific Gravity, Urine 1.025 1.005 - 1.030  ? pH 6.0 5.0 - 8.0  ? Glucose, UA NEGATIVE NEGATIVE mg/dL  ? Hgb urine dipstick TRACE (A) NEGATIVE  ? Bilirubin Urine NEGATIVE NEGATIVE  ? Ketones, ur NEGATIVE NEGATIVE mg/dL  ? Protein, ur NEGATIVE NEGATIVE mg/dL  ? Nitrite NEGATIVE NEGATIVE  ? Leukocytes,Ua NEGATIVE NEGATIVE  ?  Comment: Performed at [facility], [address] [place], [place] [address]  ?Urinalysis, Microscopic (reflex)     Status: Abnormal  ? Collection Time: 12/31/21  2:36 PM  ?Result Value Ref Range  ? RBC / HPF 0-5 0 - 5 RBC/hpf  ? WBC, UA 0-5 0 - 5 WBC/hpf  ? Bacteria, UA RARE (A) NONE SEEN  ? Squamous Epithelial / LPF 0-5 0 - 5  ? Mucus PRESENT   ?  Comment:  Performed at [facility], [address], [place], [place] [address]  ? ?CT Abdomen Pelvis W Contrast ? ?Result Date: 12/31/2021 ?CLINICAL DATA:  Abdominal pain and nausea since 2 a.m., constipation, difficulty urinating EXAM: CT ABDOMEN AND PELVIS WITH CONTRAST TECHNIQUE: Multidetector CT imaging of the abdomen and pelvis was performed using the standard protocol following bolus administration of intravenous contrast. RADIATION DOSE REDUCTION: This exam was performed according to the departmental dose-optimization program which includes automated exposure control, adjustment of the mA and/or kV according to patient size and/or use of iterative reconstruction technique. CONTRAST:  OMNIPAQUE IOHEXOL 300 MG/ML  SOLN COMPARISON:  None. FINDINGS: Lower chest: No acute pleural or parenchymal lung disease. Hepatobiliary: Multiple subcentimeter hypodensities throughout the liver likely reflect hepatic cysts. No biliary duct dilation. The gallbladder is unremarkable. Pancreas: Unremarkable. No pancreatic ductal dilatation or surrounding inflammatory changes. Spleen: Normal in size without focal abnormality. Adrenals/Urinary Tract: Nonobstructing 4 mm left renal calculus. No right-sided calculi. No obstructive uropathy within either kidney. The adrenals are unremarkable. Stomach/Bowel: The distal appendix is dilated measuring 13 mm, with mural thickening and periappendiceal fat stranding consistent with acute appendicitis. No evidence of perforation or abscess. No evidence of bowel obstruction. Mildly distended gas-filled loops of jejunum within the upper abdomen measuring 2.9 cm may reflect regional ileus. Vascular/Lymphatic: Multiple subcentimeter lymph nodes are seen within the right lower quadrant mesentery, likely reactive. No pathologic adenopathy. No significant vascular findings. Reproductive: Prostate is unremarkable. Other: Trace free fluid within the right lower quadrant. No fluid collection or  abscess. No free intraperitoneal gas. No abdominal wall hernia. Musculoskeletal: No acute or destructive bony lesions. Reconstructed images demonstrate no additional findings. IMPRESSION: 1. Acute uncomplicated appendicitis. No perforation, fluid collection, or abscess. 2. Nonobstructing 4 mm left renal calculus. 3. Nonspecific distended gas-filled loops of jejunum within the upper abdomen, which may reflect regional ileus. No bowel obstruction. Electronically Signed   By: Sharlet Salina M.D.   On: 12/31/2021 18:22   ? ?Review of Systems ?*** ?Blood pressure 121/78, pulse 81, temperature 98.3 ?F (36.8 ?C), temperature source Oral, resp. rate 18, SpO2 99 %. ? ?Physical Exam  ?*** ? ?  Assessment/Plan ?Acute appendicitis ? ?NPO ?IV fluids ?IV antibiotics ?Will need appendectomy ? ?*** ? ?Almond Lint, MD ?12/31/2021, 8:20 PM ? ? ? ?

## 2021-12-31 NOTE — ED Provider Notes (Signed)
?[facility] EMERGENCY DEPARTMENT ?Provider Note ? ? ?CSN: [id number] ?Arrival date & time: 12/31/21  1415 ? ?  ? ?History ? ?Chief Complaint  ?Patient presents with  ? Abdominal Pain  ? ? ?Kirk Bailey is a 31 y.o. male.  With no pertinent past medical history presents to the emergency department with abdominal pain. ? ?Patient states that around 5 AM this morning he began having cramping, constant abdominal pain that he describes as epigastric and suprapubic.  He states that he has had 4 episodes of nausea with nonbloody vomiting.  Additionally has had "a little diarrhea" earlier this morning.  He also endorses that he has had difficulty urinating and dysuria that started today and has since this morning been constipated.  He has had decreased p.o. intake but tolerating water.  He denies any fevers, chills or diaphoresis, previous abdominal surgeries, penile or testicular pain or swelling, penile discharge, recent travel, cough. ? ? ?Abdominal Pain ?Associated symptoms: constipation, diarrhea, dysuria, nausea and vomiting   ?Associated symptoms: no chest pain, no cough, no fever, no hematuria and no shortness of breath   ? ?  ? ?Home Medications ?Prior to Admission medications   ?Medication Sig Start Date End Date Taking? Authorizing Provider  ?cyclobenzaprine (FLEXERIL) 5 MG tablet Take 1 tablet (5 mg total) by mouth at bedtime as needed for muscle spasms (Caution May Cause Drowsiness). 04/06/13   Johnson, Clanford L, MD  ?diclofenac sodium (VOLTAREN) 1 % GEL Apply 2 g topically 4 (four) times daily. 08/18/13   Quentin Angst, MD  ?naproxen (NAPROSYN) 500 MG tablet Arabic Instructions Please :  Take 1 tablet with food every 12 hours PRN back pain 04/06/13   Cleora Fleet, MD  ?UNABLE TO FIND LEFT WRIST SPLINT 07/14/13   Cathren Harsh, MD  ?   ? ?Allergies    ?Patient has no known allergies.   ? ?Review of Systems   ?Review of Systems  ?Constitutional:  Positive for appetite change. Negative  for fever.  ?Respiratory:  Negative for cough and shortness of breath.   ?Cardiovascular:  Negative for chest pain.  ?Gastrointestinal:  Positive for abdominal pain, constipation, diarrhea, nausea and vomiting. Negative for blood in stool.  ?Genitourinary:  Positive for difficulty urinating and dysuria. Negative for flank pain, hematuria, penile discharge, penile pain, penile swelling, testicular pain and urgency.  ?All other systems reviewed and are negative. ? ?Physical Exam ?Updated Vital Signs ?BP 112/74   Pulse 72   Temp 98.3 ?F (36.8 ?C) (Oral)   Resp 18   SpO2 98%  ?Physical Exam ?Vitals and nursing note reviewed.  ?Constitutional:   ?   General: He is not in acute distress. ?   Appearance: Normal appearance. He is well-developed. He is not ill-appearing, toxic-appearing or diaphoretic.  ?HENT:  ?   Head: Normocephalic and atraumatic.  ?   Mouth/Throat:  ?   Mouth: Mucous membranes are moist.  ?   Pharynx: Oropharynx is clear.  ?Eyes:  ?   General: No scleral icterus. ?   Extraocular Movements: Extraocular movements intact.  ?Cardiovascular:  ?   Rate and Rhythm: Normal rate and regular rhythm.  ?   Pulses: Normal pulses.  ?   Heart sounds: Normal heart sounds. No murmur heard. ?Pulmonary:  ?   Effort: Pulmonary effort is normal. No respiratory distress.  ?   Breath sounds: Normal breath sounds.  ?Abdominal:  ?   General: Abdomen is protuberant. Bowel sounds are decreased. There  is no distension.  ?   Palpations: Abdomen is soft.  ?   Tenderness: There is abdominal tenderness in the epigastric area, periumbilical area and suprapubic area. There is no right CVA tenderness, left CVA tenderness, guarding or rebound. Negative signs include Murphy's sign and McBurney's sign.  ?   Hernia: No hernia is present.  ?Musculoskeletal:     ?   General: Normal range of motion.  ?   Cervical back: Neck supple.  ?Skin: ?   General: Skin is warm and dry.  ?   Capillary Refill: Capillary refill takes less than 2 seconds.   ?   Findings: No rash.  ?Neurological:  ?   General: No focal deficit present.  ?   Mental Status: He is alert and oriented to person, place, and time. Mental status is at baseline.  ?Psychiatric:     ?   Mood and Affect: Mood normal.     ?   Behavior: Behavior normal.     ?   Thought Content: Thought content normal.     ?   Judgment: Judgment normal.  ? ? ?ED Results / Procedures / Treatments   ?Labs ?(all labs ordered are listed, but only abnormal results are displayed) ?Labs Reviewed  ?CBC WITH DIFFERENTIAL/PLATELET - Abnormal; Notable for the following components:  ?    Result Value  ? WBC 20.7 (*)   ? Neutro Abs 17.4 (*)   ? Abs Immature Granulocytes 0.13 (*)   ? All other components within normal limits  ?COMPREHENSIVE METABOLIC PANEL - Abnormal; Notable for the following components:  ? CO2 21 (*)   ? Glucose, Bld 112 (*)   ? All other components within normal limits  ?URINALYSIS, ROUTINE W REFLEX MICROSCOPIC - Abnormal; Notable for the following components:  ? Hgb urine dipstick TRACE (*)   ? All other components within normal limits  ?URINALYSIS, MICROSCOPIC (REFLEX) - Abnormal; Notable for the following components:  ? Bacteria, UA RARE (*)   ? All other components within normal limits  ?URINE CULTURE  ?LIPASE, BLOOD  ? ?EKG ?None ? ?Radiology ?CT Abdomen Pelvis W Contrast ? ?Result Date: 12/31/2021 ?CLINICAL DATA:  Abdominal pain and nausea since 2 a.m., constipation, difficulty urinating EXAM: CT ABDOMEN AND PELVIS WITH CONTRAST TECHNIQUE: Multidetector CT imaging of the abdomen and pelvis was performed using the standard protocol following bolus administration of intravenous contrast. RADIATION DOSE REDUCTION: This exam was performed according to the departmental dose-optimization program which includes automated exposure control, adjustment of the mA and/or kV according to patient size and/or use of iterative reconstruction technique. CONTRAST:  100mL OMNIPAQUE IOHEXOL 300 MG/ML  SOLN COMPARISON:  None.  FINDINGS: Lower chest: No acute pleural or parenchymal lung disease. Hepatobiliary: Multiple subcentimeter hypodensities throughout the liver likely reflect hepatic cysts. No biliary duct dilation. The gallbladder is unremarkable. Pancreas: Unremarkable. No pancreatic ductal dilatation or surrounding inflammatory changes. Spleen: Normal in size without focal abnormality. Adrenals/Urinary Tract: Nonobstructing 4 mm left renal calculus. No right-sided calculi. No obstructive uropathy within either kidney. The adrenals are unremarkable. Stomach/Bowel: The distal appendix is dilated measuring 13 mm, with mural thickening and periappendiceal fat stranding consistent with acute appendicitis. No evidence of perforation or abscess. No evidence of bowel obstruction. Mildly distended gas-filled loops of jejunum within the upper abdomen measuring 2.9 cm may reflect regional ileus. Vascular/Lymphatic: Multiple subcentimeter lymph nodes are seen within the right lower quadrant mesentery, likely reactive. No pathologic adenopathy. No significant vascular findings. Reproductive: Prostate is unremarkable. Other:  Trace free fluid within the right lower quadrant. No fluid collection or abscess. No free intraperitoneal gas. No abdominal wall hernia. Musculoskeletal: No acute or destructive bony lesions. Reconstructed images demonstrate no additional findings. IMPRESSION: 1. Acute uncomplicated appendicitis. No perforation, fluid collection, or abscess. 2. Nonobstructing 4 mm left renal calculus. 3. Nonspecific distended gas-filled loops of jejunum within the upper abdomen, which may reflect regional ileus. No bowel obstruction. Electronically Signed   By: Sharlet Salina M.D.   On: 12/31/2021 18:22   ? ?Procedures ?Procedures  ? ?Medications Ordered in ED ?Medications  ?cefTRIAXone (ROCEPHIN) 2 g in sodium chloride 0.9 % 100 mL IVPB (0 g Intravenous Stopped 12/31/21 1924)  ?  And  ?metroNIDAZOLE (FLAGYL) IVPB 500 mg (has no  administration in time range)  ?sodium chloride 0.9 % bolus 1,000 mL (0 mLs Intravenous Stopped 12/31/21 1924)  ?morphine (PF) 4 MG/ML injection 4 mg (4 mg Intravenous Given 12/31/21 1725)  ?iohexol (OMNIPAQUE) 300 MG/

## 2022-01-01 ENCOUNTER — Emergency Department (HOSPITAL_BASED_OUTPATIENT_CLINIC_OR_DEPARTMENT_OTHER): Payer: Medicaid Other | Admitting: Anesthesiology

## 2022-01-01 ENCOUNTER — Other Ambulatory Visit: Payer: Self-pay

## 2022-01-01 ENCOUNTER — Emergency Department (HOSPITAL_COMMUNITY): Payer: Medicaid Other | Admitting: Anesthesiology

## 2022-01-01 ENCOUNTER — Encounter (HOSPITAL_COMMUNITY): Payer: Self-pay

## 2022-01-01 ENCOUNTER — Encounter (HOSPITAL_COMMUNITY)
Admission: EM | Disposition: A | Discharge status: Home / Self Care | Payer: Self-pay | Source: Home / Self Care | Attending: Emergency Medicine

## 2022-01-01 DIAGNOSIS — F17200 Nicotine dependence, unspecified, uncomplicated: Secondary | ICD-10-CM | POA: Diagnosis not present

## 2022-01-01 DIAGNOSIS — K37 Unspecified appendicitis: Secondary | ICD-10-CM | POA: Diagnosis present

## 2022-01-01 DIAGNOSIS — Z789 Other specified health status: Secondary | ICD-10-CM

## 2022-01-01 DIAGNOSIS — K3580 Unspecified acute appendicitis: Secondary | ICD-10-CM | POA: Diagnosis not present

## 2022-01-01 DIAGNOSIS — K353 Acute appendicitis with localized peritonitis, without perforation or gangrene: Secondary | ICD-10-CM | POA: Diagnosis present

## 2022-01-01 HISTORY — PX: LAPAROSCOPIC APPENDECTOMY: SHX408

## 2022-01-01 SURGERY — APPENDECTOMY, LAPAROSCOPIC
Anesthesia: General

## 2022-01-01 MED ORDER — FENTANYL CITRATE (PF) 100 MCG/2ML IJ SOLN
INTRAMUSCULAR | Status: AC
  Filled 2022-01-01: qty 2

## 2022-01-01 MED ORDER — ONDANSETRON HCL 4 MG/2ML IJ SOLN
INTRAMUSCULAR | Status: AC
  Filled 2022-01-01: qty 2

## 2022-01-01 MED ORDER — BUPIVACAINE LIPOSOME 1.3 % IJ SUSP
INTRAMUSCULAR | Status: DC | PRN
  Administered 2022-01-01: 20 mL

## 2022-01-01 MED ORDER — DIPHENHYDRAMINE HCL 50 MG/ML IJ SOLN: 12.5000 mg | Freq: Four times a day (QID) | INTRAMUSCULAR | Status: DC | PRN

## 2022-01-01 MED ORDER — DEXMEDETOMIDINE (PRECEDEX) IN NS 20 MCG/5ML (4 MCG/ML) IV SYRINGE
PREFILLED_SYRINGE | INTRAVENOUS | Status: DC | PRN
  Administered 2022-01-01: 20 ug via INTRAVENOUS
  Administered 2022-01-01: 8 ug via INTRAVENOUS

## 2022-01-01 MED ORDER — CYCLOBENZAPRINE HCL 5 MG PO TABS: 5.0000 mg | ORAL_TABLET | Freq: Every evening | ORAL | Status: DC | PRN

## 2022-01-01 MED ORDER — BUPIVACAINE LIPOSOME 1.3 % IJ SUSP
INTRAMUSCULAR | Status: AC
  Filled 2022-01-01: qty 20

## 2022-01-01 MED ORDER — DEXAMETHASONE SODIUM PHOSPHATE 10 MG/ML IJ SOLN
INTRAMUSCULAR | Status: DC | PRN
Start: 2022-01-01 — End: 2022-01-01
  Administered 2022-01-01: 10 mg via INTRAVENOUS

## 2022-01-01 MED ORDER — ONDANSETRON HCL 4 MG/2ML IJ SOLN: 4.0000 mg | Freq: Four times a day (QID) | INTRAMUSCULAR | Status: DC | PRN

## 2022-01-01 MED ORDER — DOCUSATE SODIUM 100 MG PO CAPS: 100.0000 mg | ORAL_CAPSULE | Freq: Two times a day (BID) | ORAL | Status: DC

## 2022-01-01 MED ORDER — LACTATED RINGERS IV SOLN: INTRAVENOUS | Status: DC

## 2022-01-01 MED ORDER — ROCURONIUM BROMIDE 100 MG/10ML IV SOLN
INTRAVENOUS | Status: DC | PRN
  Administered 2022-01-01: 60 mg via INTRAVENOUS

## 2022-01-01 MED ORDER — GABAPENTIN 300 MG PO CAPS: 300.0000 mg | ORAL_CAPSULE | Freq: Two times a day (BID) | ORAL | Status: DC

## 2022-01-01 MED ORDER — SODIUM CHLORIDE 0.9 % IV SOLN: Freq: Three times a day (TID) | INTRAVENOUS | Status: DC | PRN

## 2022-01-01 MED ORDER — TRAMADOL HCL 50 MG PO TABS: 50.0000 mg | ORAL_TABLET | Freq: Four times a day (QID) | ORAL | 0 refills | Status: AC | PRN

## 2022-01-01 MED ORDER — METHOCARBAMOL 500 MG PO TABS: 500.0000 mg | ORAL_TABLET | Freq: Four times a day (QID) | ORAL | Status: DC | PRN

## 2022-01-01 MED ORDER — FENTANYL CITRATE (PF) 100 MCG/2ML IJ SOLN
INTRAMUSCULAR | Status: DC | PRN
Start: 2022-01-01 — End: 2022-01-01
  Administered 2022-01-01: 50 ug via INTRAVENOUS
  Administered 2022-01-01: 100 ug via INTRAVENOUS
  Administered 2022-01-01: 50 ug via INTRAVENOUS

## 2022-01-01 MED ORDER — MELATONIN 3 MG PO TABS: 3.0000 mg | ORAL_TABLET | Freq: Every evening | ORAL | Status: DC | PRN

## 2022-01-01 MED ORDER — TRAMADOL HCL 50 MG PO TABS: 50.0000 mg | ORAL_TABLET | Freq: Four times a day (QID) | ORAL | Status: DC | PRN

## 2022-01-01 MED ORDER — SODIUM CHLORIDE 0.9% FLUSH
3.0000 mL | Freq: Two times a day (BID) | INTRAVENOUS | Status: DC
  Administered 2022-01-01: 3 mL via INTRAVENOUS

## 2022-01-01 MED ORDER — ACETAMINOPHEN 325 MG PO TABS: 650.0000 mg | ORAL_TABLET | Freq: Four times a day (QID) | ORAL | Status: DC | PRN

## 2022-01-01 MED ORDER — DIPHENHYDRAMINE HCL 12.5 MG/5ML PO ELIX: 12.5000 mg | ORAL_SOLUTION | Freq: Four times a day (QID) | ORAL | Status: DC | PRN

## 2022-01-01 MED ORDER — PROCHLORPERAZINE EDISYLATE 10 MG/2ML IJ SOLN: 5.0000 mg | Freq: Four times a day (QID) | INTRAMUSCULAR | Status: DC | PRN

## 2022-01-01 MED ORDER — FENTANYL CITRATE PF 50 MCG/ML IJ SOSY: 25.0000 ug | PREFILLED_SYRINGE | INTRAMUSCULAR | Status: DC | PRN

## 2022-01-01 MED ORDER — SUGAMMADEX SODIUM 200 MG/2ML IV SOLN
INTRAVENOUS | Status: DC | PRN
  Administered 2022-01-01: 200 mg via INTRAVENOUS

## 2022-01-01 MED ORDER — PROCHLORPERAZINE MALEATE 10 MG PO TABS
10.0000 mg | ORAL_TABLET | Freq: Four times a day (QID) | ORAL | Status: DC | PRN
  Filled 2022-01-01: qty 1

## 2022-01-01 MED ORDER — METOPROLOL TARTRATE 5 MG/5ML IV SOLN: 5.0000 mg | Freq: Four times a day (QID) | INTRAVENOUS | Status: DC | PRN

## 2022-01-01 MED ORDER — SIMETHICONE 80 MG PO CHEW
40.0000 mg | CHEWABLE_TABLET | Freq: Four times a day (QID) | ORAL | Status: DC | PRN
Start: 2022-01-01 — End: 2022-01-02

## 2022-01-01 MED ORDER — METRONIDAZOLE 500 MG/100ML IV SOLN: 500.0000 mg | Freq: Two times a day (BID) | INTRAVENOUS | Status: DC

## 2022-01-01 MED ORDER — PROCHLORPERAZINE MALEATE 10 MG PO TABS: 10.0000 mg | ORAL_TABLET | Freq: Four times a day (QID) | ORAL | Status: DC | PRN

## 2022-01-01 MED ORDER — HYDROMORPHONE HCL 1 MG/ML IJ SOLN: 0.5000 mg | INTRAMUSCULAR | Status: DC | PRN

## 2022-01-01 MED ORDER — NAPROXEN 250 MG PO TABS
500.0000 mg | ORAL_TABLET | Freq: Two times a day (BID) | ORAL | Status: DC | PRN
  Filled 2022-01-01: qty 2

## 2022-01-01 MED ORDER — ENOXAPARIN SODIUM 40 MG/0.4ML IJ SOSY: 40.0000 mg | PREFILLED_SYRINGE | INTRAMUSCULAR | Status: DC

## 2022-01-01 MED ORDER — CELECOXIB 200 MG PO CAPS
200.0000 mg | ORAL_CAPSULE | ORAL | Status: AC
  Administered 2022-01-01: 200 mg via ORAL
  Filled 2022-01-01: qty 1

## 2022-01-01 MED ORDER — SODIUM CHLORIDE 0.9% FLUSH: 3.0000 mL | INTRAVENOUS | Status: DC | PRN

## 2022-01-01 MED ORDER — METRONIDAZOLE 500 MG/100ML IV SOLN
500.0000 mg | Freq: Three times a day (TID) | INTRAVENOUS | Status: DC
  Administered 2022-01-01: 500 mg via INTRAVENOUS
  Filled 2022-01-01: qty 100

## 2022-01-01 MED ORDER — MIDAZOLAM HCL 5 MG/5ML IJ SOLN
INTRAMUSCULAR | Status: DC | PRN
  Administered 2022-01-01 (×2): 2 mg via INTRAVENOUS

## 2022-01-01 MED ORDER — BISACODYL 10 MG RE SUPP: 10.0000 mg | Freq: Every day | RECTAL | Status: DC | PRN

## 2022-01-01 MED ORDER — BUPIVACAINE-EPINEPHRINE 0.25% -1:200000 IJ SOLN
INTRAMUSCULAR | Status: DC | PRN
  Administered 2022-01-01: 60 mL

## 2022-01-01 MED ORDER — SODIUM CHLORIDE 0.9 % IV SOLN: 250.0000 mL | INTRAVENOUS | Status: DC | PRN

## 2022-01-01 MED ORDER — GABAPENTIN 300 MG PO CAPS
300.0000 mg | ORAL_CAPSULE | ORAL | Status: AC
  Administered 2022-01-01: 300 mg via ORAL
  Filled 2022-01-01: qty 1

## 2022-01-01 MED ORDER — CHLORHEXIDINE GLUCONATE CLOTH 2 % EX PADS: 6.0000 | MEDICATED_PAD | Freq: Once | CUTANEOUS | Status: DC

## 2022-01-01 MED ORDER — LIDOCAINE HCL (PF) 2 % IJ SOLN
INTRAMUSCULAR | Status: AC
  Filled 2022-01-01: qty 5

## 2022-01-01 MED ORDER — ACETAMINOPHEN 500 MG PO TABS
1000.0000 mg | ORAL_TABLET | Freq: Four times a day (QID) | ORAL | Status: DC
  Administered 2022-01-01: 1000 mg via ORAL
  Filled 2022-01-01: qty 2

## 2022-01-01 MED ORDER — SODIUM CHLORIDE 0.9 % IV SOLN
2.0000 g | INTRAVENOUS | Status: AC
  Administered 2022-01-01: 2 g via INTRAVENOUS
  Filled 2022-01-01: qty 20

## 2022-01-01 MED ORDER — ORAL CARE MOUTH RINSE: 15.0000 mL | Freq: Once | OROMUCOSAL | Status: AC

## 2022-01-01 MED ORDER — DEXAMETHASONE SODIUM PHOSPHATE 10 MG/ML IJ SOLN
INTRAMUSCULAR | Status: AC
  Filled 2022-01-01: qty 1

## 2022-01-01 MED ORDER — CHLORHEXIDINE GLUCONATE 0.12 % MT SOLN
15.0000 mL | Freq: Once | OROMUCOSAL | Status: AC
  Administered 2022-01-01: 15 mL via OROMUCOSAL

## 2022-01-01 MED ORDER — MAGIC MOUTHWASH
15.0000 mL | Freq: Four times a day (QID) | ORAL | Status: DC | PRN
Start: 2022-01-01 — End: 2022-01-02
  Filled 2022-01-01: qty 15

## 2022-01-01 MED ORDER — DICLOFENAC SODIUM 1 % EX GEL
2.0000 g | Freq: Four times a day (QID) | CUTANEOUS | Status: DC
  Filled 2022-01-01: qty 100

## 2022-01-01 MED ORDER — PROPOFOL 10 MG/ML IV BOLUS
INTRAVENOUS | Status: DC | PRN
  Administered 2022-01-01: 200 mg via INTRAVENOUS

## 2022-01-01 MED ORDER — METHOCARBAMOL 1000 MG/10ML IJ SOLN
1000.0000 mg | Freq: Four times a day (QID) | INTRAVENOUS | Status: DC | PRN
  Filled 2022-01-01: qty 10

## 2022-01-01 MED ORDER — BUPIVACAINE LIPOSOME 1.3 % IJ SUSP: 20.0000 mL | Freq: Once | INTRAMUSCULAR | Status: AC

## 2022-01-01 MED ORDER — KCL-LACTATED RINGERS-D5W 20 MEQ/L IV SOLN: INTRAVENOUS | Status: DC

## 2022-01-01 MED ORDER — BUPIVACAINE-EPINEPHRINE (PF) 0.25% -1:200000 IJ SOLN
INTRAMUSCULAR | Status: AC
  Filled 2022-01-01: qty 30

## 2022-01-01 MED ORDER — ACETAMINOPHEN 10 MG/ML IV SOLN: 1000.0000 mg | Freq: Once | INTRAVENOUS | Status: DC | PRN

## 2022-01-01 MED ORDER — SODIUM CHLORIDE 0.9 % IV SOLN: 2.0000 g | INTRAVENOUS | Status: DC

## 2022-01-01 MED ORDER — ONDANSETRON HCL 4 MG/2ML IJ SOLN
INTRAMUSCULAR | Status: DC | PRN
  Administered 2022-01-01: 4 mg via INTRAVENOUS

## 2022-01-01 MED ORDER — ONDANSETRON 4 MG PO TBDP: 4.0000 mg | ORAL_TABLET | Freq: Four times a day (QID) | ORAL | Status: DC | PRN

## 2022-01-01 MED ORDER — ACETAMINOPHEN 650 MG RE SUPP: 650.0000 mg | Freq: Four times a day (QID) | RECTAL | Status: DC | PRN

## 2022-01-01 MED ORDER — MORPHINE SULFATE (PF) 2 MG/ML IV SOLN: 1.0000 mg | INTRAVENOUS | Status: DC | PRN

## 2022-01-01 MED ORDER — ROCURONIUM BROMIDE 10 MG/ML (PF) SYRINGE
PREFILLED_SYRINGE | INTRAVENOUS | Status: AC
  Filled 2022-01-01: qty 10

## 2022-01-01 MED ORDER — ACETAMINOPHEN 500 MG PO TABS
1000.0000 mg | ORAL_TABLET | ORAL | Status: AC
  Administered 2022-01-01: 1000 mg via ORAL
  Filled 2022-01-01: qty 2

## 2022-01-01 MED ORDER — MAGNESIUM HYDROXIDE 400 MG/5ML PO SUSP: 30.0000 mL | Freq: Every day | ORAL | Status: DC | PRN

## 2022-01-01 MED ORDER — PROPOFOL 10 MG/ML IV BOLUS
INTRAVENOUS | Status: AC
Start: 2022-01-01 — End: ?
  Filled 2022-01-01: qty 20

## 2022-01-01 MED ORDER — KETOROLAC TROMETHAMINE 30 MG/ML IJ SOLN
INTRAMUSCULAR | Status: AC
  Filled 2022-01-01: qty 1

## 2022-01-01 MED ORDER — OXYCODONE HCL 5 MG PO TABS
5.0000 mg | ORAL_TABLET | ORAL | Status: DC | PRN
  Administered 2022-01-01: 10 mg via ORAL
  Filled 2022-01-01: qty 2

## 2022-01-01 MED ORDER — LIP MEDEX EX OINT
1.0000 "application " | TOPICAL_OINTMENT | Freq: Two times a day (BID) | CUTANEOUS | Status: DC
  Administered 2022-01-01: 1 via TOPICAL

## 2022-01-01 MED ORDER — MIDAZOLAM HCL 2 MG/2ML IJ SOLN
INTRAMUSCULAR | Status: AC
  Filled 2022-01-01: qty 2

## 2022-01-01 MED ORDER — MORPHINE SULFATE (PF) 4 MG/ML IV SOLN
4.0000 mg | INTRAVENOUS | Status: DC | PRN
  Administered 2022-01-01: 4 mg via INTRAVENOUS
  Filled 2022-01-01: qty 1

## 2022-01-01 MED ORDER — LIDOCAINE HCL (CARDIAC) PF 100 MG/5ML IV SOSY
PREFILLED_SYRINGE | INTRAVENOUS | Status: DC | PRN
  Administered 2022-01-01: 50 mg via INTRAVENOUS

## 2022-01-01 MED ORDER — OXYCODONE HCL 5 MG PO TABS: 5.0000 mg | ORAL_TABLET | ORAL | Status: DC | PRN

## 2022-01-01 MED ORDER — MIDAZOLAM HCL 2 MG/2ML IJ SOLN
INTRAMUSCULAR | Status: AC
Start: 2022-01-01 — End: ?
  Filled 2022-01-01: qty 2

## 2022-01-01 MED ORDER — SUCCINYLCHOLINE CHLORIDE 200 MG/10ML IV SOSY
PREFILLED_SYRINGE | INTRAVENOUS | Status: AC
  Filled 2022-01-01: qty 10

## 2022-01-01 SURGICAL SUPPLY — 53 items
APPLIER CLIP 5 13 M/L LIGAMAX5 (MISCELLANEOUS)
APPLIER CLIP ROT 10 11.4 M/L (STAPLE)
BAG COUNTER SPONGE SURGICOUNT (BAG) IMPLANT
CABLE HIGH FREQUENCY MONO STRZ (ELECTRODE) ×2 IMPLANT
CHLORAPREP W/TINT 26 (MISCELLANEOUS) ×2 IMPLANT
CLIP APPLIE 5 13 M/L LIGAMAX5 (MISCELLANEOUS) IMPLANT
CLIP APPLIE ROT 10 11.4 M/L (STAPLE) IMPLANT
COVER SURGICAL LIGHT HANDLE (MISCELLANEOUS) ×2 IMPLANT
DEVICE TROCAR PUNCTURE CLOSURE (ENDOMECHANICALS) IMPLANT
DRAPE LAPAROSCOPIC ABDOMINAL (DRAPES) ×2 IMPLANT
DRAPE WARM FLUID 44X44 (DRAPES) ×2 IMPLANT
DRSG TEGADERM 2-3/8X2-3/4 SM (GAUZE/BANDAGES/DRESSINGS) ×4 IMPLANT
DRSG TEGADERM 4X4.75 (GAUZE/BANDAGES/DRESSINGS) ×2 IMPLANT
ELECT REM PT RETURN 15FT ADLT (MISCELLANEOUS) ×2 IMPLANT
ENDOLOOP SUT PDS II  0 18 (SUTURE)
ENDOLOOP SUT PDS II 0 18 (SUTURE) IMPLANT
GAUZE SPONGE 2X2 8PLY STRL LF (GAUZE/BANDAGES/DRESSINGS) ×1 IMPLANT
GLOVE ECLIPSE 8.0 STRL XLNG CF (GLOVE) ×2 IMPLANT
GLOVE INDICATOR 8.0 STRL GRN (GLOVE) ×2 IMPLANT
GOWN STRL REUS W/ TWL XL LVL3 (GOWN DISPOSABLE) ×3 IMPLANT
GOWN STRL REUS W/TWL XL LVL3 (GOWN DISPOSABLE) ×3
IRRIG SUCT STRYKERFLOW 2 WTIP (MISCELLANEOUS) ×2
IRRIGATION SUCT STRKRFLW 2 WTP (MISCELLANEOUS) ×1 IMPLANT
KIT BASIN OR (CUSTOM PROCEDURE TRAY) ×2 IMPLANT
KIT TURNOVER KIT A (KITS) IMPLANT
PAD POSITIONING PINK XL (MISCELLANEOUS) ×2 IMPLANT
PENCIL SMOKE EVACUATOR (MISCELLANEOUS) IMPLANT
POUCH RETRIEVAL ECOSAC 10 (ENDOMECHANICALS) ×1 IMPLANT
POUCH RETRIEVAL ECOSAC 10MM (ENDOMECHANICALS) ×2
RELOAD STAPLE 60 3.6 BLU REG (STAPLE) IMPLANT
RELOAD STAPLE 60 4.1 GRN THCK (STAPLE) IMPLANT
RELOAD STAPLER BLUE 60MM (STAPLE) ×1 IMPLANT
RELOAD STAPLER GREEN 60MM (STAPLE) IMPLANT
SCISSORS LAP 5X35 DISP (ENDOMECHANICALS) ×2 IMPLANT
SET TUBE SMOKE EVAC HIGH FLOW (TUBING) ×2 IMPLANT
SHEARS HARMONIC ACE PLUS 36CM (ENDOMECHANICALS) ×1 IMPLANT
SLEEVE XCEL OPT CAN 5 100 (ENDOMECHANICALS) ×2 IMPLANT
SPIKE FLUID TRANSFER (MISCELLANEOUS) ×2 IMPLANT
SPONGE GAUZE 2X2 STER 10/PKG (GAUZE/BANDAGES/DRESSINGS) ×1
STAPLE ECHEON FLEX 60 POW ENDO (STAPLE) IMPLANT
STAPLER ECHELON LONG 60 440 (INSTRUMENTS) ×1 IMPLANT
STAPLER RELOAD BLUE 60MM (STAPLE) ×2
STAPLER RELOAD GREEN 60MM (STAPLE)
SUT MNCRL AB 4-0 PS2 18 (SUTURE) ×2 IMPLANT
SUT PDS AB 0 CT1 36 (SUTURE) IMPLANT
SUT PDS AB 1 CT1 27 (SUTURE) IMPLANT
SUT SILK 2 0 SH (SUTURE) IMPLANT
TOWEL OR 17X26 10 PK STRL BLUE (TOWEL DISPOSABLE) ×2 IMPLANT
TRAY FOLEY MTR SLVR 14FR STAT (SET/KITS/TRAYS/PACK) ×2 IMPLANT
TRAY FOLEY MTR SLVR 16FR STAT (SET/KITS/TRAYS/PACK) IMPLANT
TRAY LAPAROSCOPIC (CUSTOM PROCEDURE TRAY) ×2 IMPLANT
TROCAR BLADELESS OPT 5 100 (ENDOMECHANICALS) ×2 IMPLANT
TROCAR XCEL 12X100 BLDLESS (ENDOMECHANICALS) ×2 IMPLANT

## 2022-01-01 NOTE — Discharge Instructions (Addendum)
################################################################ ? ?LAPAROSCOPIC SURGERY: POST OP INSTRUCTIONS ? ?###################################################################### ? ?EAT ?Gradually transition to a high fiber diet with a fiber supplement over the next few weeks after discharge.  Start with a pureed / full liquid diet (see below) ? ?WALK ?Walk an hour a day.  Control your pain to do that.   ? ?CONTROL PAIN ?Control pain so that you can walk, sleep, tolerate sneezing/coughing, go up/down stairs. ? ?HAVE A BOWEL MOVEMENT DAILY ?Keep your bowels regular to avoid problems.  OK to try a laxative to override constipation.  OK to use an antidairrheal to slow down diarrhea.  Call if not better after 2 tries ? ?CALL IF YOU HAVE PROBLEMS/CONCERNS ?Call if you are still struggling despite following these instructions. ?Call if you have concerns not answered by these instructions ? ?###################################################################### ? ? ? ?DIET: Follow a light bland diet & liquids the first 24 hours after arrival home, such as soup, liquids, starches, etc.  Be sure to drink plenty of fluids.  Quickly advance to a usual solid diet within a few days.  Avoid fast food or heavy meals as your are more likely to get nauseated or have irregular bowels.  A low-fat, high-fiber diet for the rest of your life is ideal. ? ?Take your usually prescribed home medications unless otherwise directed. ? ?PAIN CONTROL: ?Pain is best controlled by a usual combination of three different methods TOGETHER: ?Ice/Heat ?Over the counter pain medication ?Prescription pain medication ?Most patients will experience some swelling and bruising around the incisions.  Ice packs or heating pads (30-60 minutes up to 6 times a day) will help. Use ice for the first few days to help decrease swelling and bruising, then switch to heat to help relax tight/sore spots and speed recovery.  Some people prefer to use ice alone, heat  alone, alternating between ice & heat.  Experiment to what works for you.  Swelling and bruising can take several weeks to resolve.   ?It is helpful to take an over-the-counter pain medication regularly for the first few weeks.  Choose one of the following that works best for you: ?Naproxen (Aleve, etc)  Two 220mg tabs twice a day ?Ibuprofen (Advil, etc) Three 200mg tabs four times a day (every meal & bedtime) ?Acetaminophen (Tylenol, etc) 500-650mg four times a day (every meal & bedtime) ?A  prescription for pain medication (such as oxycodone, hydrocodone, tramadol, gabapentin, methocarbamol, etc) should be given to you upon discharge.  Take your pain medication as prescribed.  ?If you are having problems/concerns with the prescription medicine (does not control pain, nausea, vomiting, rash, itching, etc), please call us [phone] to see if we need to switch you to a different pain medicine that will work better for you and/or control your side effect better. ?If you need a refill on your pain medication, please give us 48 hour notice.  contact your pharmacy.  They will contact our office to request authorization. Prescriptions will not be filled after 5 pm or on week-ends ? ?Avoid getting constipated.   ?Between the surgery and the pain medications, it is common to experience some constipation.   ?Increasing fluid intake and taking a fiber supplement (such as Metamucil, Citrucel, FiberCon, MiraLax, etc) 1-2 times a day regularly will usually help prevent this problem from occurring.   ?A mild laxative (prune juice, Milk of Magnesia, MiraLax, etc) should be taken according to package directions if there are no bowel movements after 48 hours.   ?Watch out for diarrhea.   ?  If you have many loose bowel movements, simplify your diet to bland foods & liquids for a few days.   ?Stop any stool softeners and decrease your fiber supplement.   ?Switching to mild anti-diarrheal medications (Kayopectate, Pepto Bismol) can  help.   ?If this worsens or does not improve, please call us. ? ?Wash / shower every day.  You may shower over the dressings as they are waterproof.  Continue to shower over incision(s) after the dressing is off. ? ?REMOVE ALL DRESSINGS: Remove your waterproof bandages (tegaderm clear band-aids, steristrip skin tapes, etc) THREE DAYS AFTER SURGERY.  You may leave the incisions open to air.  You may replace a dressing/Band-Aid to cover the incision for comfort if you wish.  ? ?ACTIVITIES as tolerated:   ?You may resume regular (light) daily activities beginning the next day--such as daily self-care, walking, climbing stairs--gradually increasing activities as tolerated.  If you can walk 30 minutes without difficulty, it is safe to try more intense activity such as jogging, treadmill, bicycling, low-impact aerobics, swimming, etc. ?Save the most intensive and strenuous activity for last such as sit-ups, heavy lifting, contact sports, etc  Refrain from any heavy lifting or straining until you are off narcotics for pain control.   ?DO NOT PUSH THROUGH PAIN.  Let pain be your guide: If it hurts to do something, don't do it.  Pain is your body warning you to avoid that activity for another week until the pain goes down. ?You may drive when you are no longer taking prescription pain medication, you can comfortably wear a seatbelt, and you can safely maneuver your car and apply brakes. ?You may have sexual intercourse when it is comfortable. ? ?FOLLOW UP in our office ?Please call [facility] at [phone] to set up an appointment to see your surgeon in the office for a follow-up appointment approximately 2-3 weeks after your surgery. ?Make sure that you call for this appointment the day you arrive home to insure a convenient appointment time. ? ?10. IF YOU HAVE DISABILITY OR FAMILY LEAVE FORMS, BRING THEM TO THE OFFICE FOR PROCESSING.  DO NOT GIVE THEM TO YOUR DOCTOR. ? ? ?WHEN TO CALL us [phone]: ?Poor pain  control ?Reactions / problems with new medications (rash/itching, nausea, etc)  ?Fever over 101.5 F (38.5 C) ?Inability to urinate ?Nausea and/or vomiting ?Worsening swelling or bruising ?Continued bleeding from incision. ?Increased pain, redness, or drainage from the incision ? ? The clinic staff is available to answer your questions during regular business hours (8:30am-5pm).  Please don?t hesitate to call and ask to speak to one of our nurses for clinical concerns.  ? If you have a medical emergency, go to the nearest emergency room or call 911. ? A surgeon from [facility] is always on call at the hospitals ? ? ?[facility], [place] ?[address], [address], [place], [place]  [address] ? ?MAIN: [phone] ? TOLL FREE: [phone] ?  ?FAX [phone] ?[email or web address] ? ?############################################################## ? ? ?FOLLOW-UP ?You should see your doctor in the office for a follow-up appointment approximately 2-3 weeks after your surgery.  You should have been given your post-op/follow-up appointment when your surgery was scheduled.  If you did not receive a post-op/follow-up appointment, make sure that you call for this appointment within a day or two after you arrive home to insure a convenient appointment time. ? ? ?WHEN TO CALL YOUR DOCTOR: ?Fever over 101.0 ?Inability to urinate ?Continued bleeding from incision. ?  Increased pain, redness, or drainage from the incision. ?Increasing abdominal pain ? ?The clinic staff is available to answer your questions during regular business hours.  Please don?t hesitate to call and ask to speak to one of the nurses for clinical concerns.  If you have a medical emergency, go to the nearest emergency room or call 911.  A surgeon from [facility] is always on call at the hospital. ?[address], [address], [place], [place]  [address] ? [address] [address], [place], [place]   [address] ?[phone] ? [phone] ? FAX [phone] ? ? ? ? ?Managing Your Pain After Surgery Without Opioids ? ? ? ?Thank you for participating in our program to help patients manage their pain after surgery without opi

## 2022-01-01 NOTE — H&P (Signed)
? ? ? ?Kirk Bailey  ?Apr 18, 1991 ?[id number] ? ?CARE TEAM: ? ?PCP: Pcp, No ? ?Outpatient Care Team: Patient Care Team: ?Pcp, No as PCP - General ? ?Inpatient Treatment Team: Treatment Team: Attending Provider: Edison Pace Md, MD; Rounding Team: Edison Pace, Md, MD ? ? ?This patient is a 31 y.o.male who presents today for surgical evaluation at the request of Mickie Hillier, PA-C.  ? ?Chief complaint / Reason for evaluation: Appendicitis ? ?Pleasant smoking male originally from [place] who noted pain starting yesterday.  Rather sharp and intense in his central abdomen but now is more focal on the right side.  Some decreased appetite and nausea.  Given persistent pain went to the emergency room at [facility].  Examination and CT scan concerning for appendicitis.  Surgical consultation requested.  He notes his father used to have chronic abdominal complaints but does not recall history of Crohn's, ulcer colitis, inflammatory bowel disease, irritable bowel syndrome.  He normally moves his bowels once or twice a day.  He smokes about a pack of tobacco a day but does not have any problems with allergy or wheezing or dyspnea on exertion.  No cardiac or pulmonary issues.  No liver or pancreas issues that he is aware of.  No prior abdominal surgeries.  Can walk 20 minutes without difficulty. ? ? ?Assessment  ?Kirk Bailey  31 y.o. male  ?Day of Surgery  Procedure(s): ?APPENDECTOMY LAPAROSCOPIC ? ?Problem List: ? ?Principal Problem: ?  Appendicitis ? ? ?Classic history and physical exam for appendicitis confirmed by CAT scan.  Looks early. ? ?Plan: ? ?IV antibiotics ? ?IV fluid resuscitation. ? ?Nausea pain control. ? ?Diagnostic laparoscopy with appendectomy.  Discussion made at length with a interpreter using video chat.  Allowed the patient to ask many appropriate questions.  Somewhat anxious about anesthesia but feels reassured and agrees to proceed. ? ?The anatomy & physiology of the digestive tract was discussed.  The  pathophysiology of appendicitis and other appendiceal disorders were discussed.  Natural history risks without surgery was discussed.   I feel the risks of no intervention will lead to serious problems that outweigh the operative risks; therefore, I recommended diagnostic laparoscopy with removal of appendix to remove the pathology.  Laparoscopic & open techniques were discussed.   I noted a good likelihood this will help address the problem.   ?Risks such as bleeding, infection, abscess, leak, reoperation, injury to other organs, need for repair of tissues / organs, possible ostomy, hernia, heart attack, stroke, death, and other risks were discussed.  Goals of post-operative recovery were discussed as well.  We will work to minimize complications.  Questions were answered.  The patient expresses understanding & wishes to proceed with surgery. ? ? ?-VTE prophylaxis- SCDs, etc ?-mobilize as tolerated to help recovery ? ?I reviewed nursing notes, ED provider notes, last 24 h vitals and pain scores, last 48 h intake and output, last 24 h labs and trends, and last 24 h imaging results. I have reviewed this patient's available data, including medical history, events of note, test results, etc as part of my evaluation.  A significant portion of that time was spent in counseling.  Care during the described time interval was provided by me. ? ?This care required moderate level of medical decision making.  01/01/2022 ? ?Kirk Hector, MD, FACS, MASCRS ?Esophageal, Gastrointestinal & Colorectal Surgery ?Robotic and Minimally Invasive Surgery ? ?[facility] ?[facility] [place]  [address] ?[place], [place] [address] ?[phone] Fax ?[phone] Main ? ?CONTACT INFORMATION: ? ?Weekday (9AM-5PM): Call [facility] main office at [phone] ? ?Weeknight (5PM-9AM) or Weekend/Holiday: Check [email or web address] (password " [email or web address]") for General Surgery [facility] coverage ? ?(Please, do  not use SecureChat as it is not reliable communication to operating surgeons for immediate patient care) ?  ? ? ? ?01/01/2022 ? ? ? ? ? ?History reviewed. No pertinent past medical history. ? ?History reviewed. No pertinent surgical history. ? ?Social History  ? ?Socioeconomic History  ? Marital status: Single  ?  Spouse name: Not on file  ? Number of children: Not on file  ? Years of education: Not on file  ? Highest education level: Not on file  ?Occupational History  ? Not on file  ?Tobacco Use  ? Smoking status: Every Day  ?  Types: Cigarettes  ? Smokeless tobacco: Not on file  ? Tobacco comments:  ?  1/2 pack per day  ?Substance and Sexual Activity  ? Alcohol use: No  ? Drug use: No  ? Sexual activity: Not on file  ?Other Topics Concern  ? Not on file  ?Social History Narrative  ? Not on file  ? ?Social Determinants of Health  ? ?Financial Resource Strain: Not on file  ?Food Insecurity: Not on file  ?Transportation Needs: Not on file  ?Physical Activity: Not on file  ?Stress: Not on file  ?Social Connections: Not on file  ?Intimate Partner Violence: Not on file  ? ? ?History reviewed. No pertinent family history. ? ?Current Facility-Administered Medications  ?Medication Dose Route Frequency Provider Last Rate Last Admin  ? acetaminophen (TYLENOL) tablet 1,000 mg  1,000 mg Oral On Call to OR Michael Boston, MD      ? bupivacaine liposome (EXPAREL) 1.3 % injection 266 mg  20 mL Infiltration Once Michael Boston, MD      ? cefTRIAXone (ROCEPHIN) 2 g in sodium chloride 0.9 % 100 mL IVPB  2 g Intravenous On Call to OR Michael Boston, MD      ? And  ? metroNIDAZOLE (FLAGYL) IVPB 500 mg  500 mg Intravenous Tor Netters, MD      ? celecoxib (CELEBREX) capsule 200 mg  200 mg Oral On Call to OR Michael Boston, MD      ? chlorhexidine (PERIDEX) 0.12 % solution 15 mL  15 mL Mouth/Throat Once Merlinda Frederick, MD      ? Or  ? MEDLINE mouth rinse  15 mL Mouth Rinse Once Merlinda Frederick, MD      ? Chlorhexidine Gluconate Cloth 2  % PADS 6 each  6 each Topical Once Michael Boston, MD      ? And  ? Chlorhexidine Gluconate Cloth 2 % PADS 6 each  6 each Topical Once Michael Boston, MD      ? gabapentin (NEURONTIN) capsule 300 mg  300 mg Oral On Call to OR Michael Boston, MD      ? lactated ringers infusion   Intravenous Continuous Merlinda Frederick, MD      ? [facility] Hold] morphine (PF) 4 MG/ML injection 4 mg  4 mg Intravenous Q2H PRN Cardama, Grayce Sessions, MD   4 mg at 01/01/22 0135  ?  ? ?No Known Allergies ? ?ROS:   All other systems reviewed & are negative except per HPI or as noted below: ?Constitutional:  No fevers, chills, sweats.  Weight stable ?Eyes:  No vision changes, No discharge ?HENT:  No sore throats, nasal drainage ?Lymph: No neck swelling, No bruising easily ?Pulmonary:  No cough, productive sputum ?CV: No orthopnea, PND  Patient walks several miles without difficulty.  No exertional chest/neck/shoulder/arm pain. ? ?GI:  No personal nor family history of GI/colon cancer, inflammatory bowel disease, irritable bowel syndrome, allergy such as Celiac Sprue, dietary/dairy problems, colitis, ulcers nor gastritis.  No recent sick contacts/gastroenteritis.  No travel outside the country.  No changes in diet. ? ?Renal: No UTIs, No hematuria ?Genital:  No drainage, bleeding, masses ?Musculoskeletal: No severe joint pain.  Good ROM major joints ?Skin:  No sores or lesions ?Heme/Lymph:  No easy bleeding.  No swollen lymph nodes ? ? ?BP 111/63   Pulse 61   Temp (!) 97.4 ?F (36.3 ?C) (Oral)   Resp 16   Ht 5\' 10"  (1.778 m)   Wt 93.4 kg   SpO2 99%   BMI 29.54 kg/m?  ? ?Physical Exam: ? ?Constitutional: Not cachectic.  Hygeine adequate.  Vitals signs as above.   ?Eyes: Pupils reactive, normal extraocular movements. Sclera nonicteric ?Neuro: CN II-XII intact.  No major focal sensory defects.  No major motor deficits. ?Lymph: No head/neck/groin lymphadenopathy ?Psych:  No severe agitation.  No severe anxiety.  Judgment & insight Adequate, Oriented  x4, ?HENT: Normocephalic, Mucus membranes moist.  No thrush.   ?Neck: Supple, No tracheal deviation.  No obvious thyromegaly ?Chest: No pain to chest wall compression.  Good respiratory excursion.  No audible w

## 2022-01-01 NOTE — Op Note (Addendum)
?PATIENT:  Kirk Bailey  31 y.o. male ? ?Patient Care Team: ?Pcp, No as PCP - General ?Michael Boston, MD as Consulting Physician (General Surgery) ? ?PRE-OPERATIVE DIAGNOSIS:  acute appendicitis ? ?POST-OPERATIVE DIAGNOSIS:  Acute phlegmonous appendicitis ? ?PROCEDURE:  APPENDECTOMY LAPAROSCOPIC ? ?SURGEON:  Adin Hector, MD ? ?ANESTHESIA:   local and general ? ?Regional TRANSVERSUS ABDOMINIS PLANE (TAP) nerve block for perioperative & postoperative pain control provided with liposomal bupivacaine (Experel) mixed with 0.25% bupivacaine as a Bilateral TAP block x 19mL each side at the level of the transverse abdominis & preperitoneal spaces along the flank at the anterior axillary line, from subcostal ridge to iliac crest under laparoscopic guidance   ? ?EBL:  Total I/O ?In: 1000 [I.V.:1000] ?Out: 300 [Urine:300] ? ?Delay start of Pharmacological VTE agent (>24hrs) due to surgical blood loss or risk of bleeding:  no ? ?DRAINS: none  ? ?SPECIMEN:  APPENDIX ? ?DISPOSITION OF SPECIMEN:  PATHOLOGY ? ?COUNTS:  YES ? ?PLAN OF CARE: Discharge to home after PACU ? ?PATIENT DISPOSITION:  PACU - hemodynamically stable. ? ? ?INDICATIONS: ?Patient with concerning symptoms & work up suspicious for appendicitis.  Surgery was recommended: ? ?The anatomy & physiology of the digestive tract was discussed.  The pathophysiology of appendicitis was discussed.  Natural history risks without surgery was discussed.   I feel the risks of no intervention will lead to serious problems that outweigh the operative risks; therefore, I recommended diagnostic laparoscopy with removal of appendix to remove the pathology.  Laparoscopic & open techniques were discussed.   I noted a good likelihood this will help address the problem.   ? ?Risks such as bleeding, infection, abscess, leak, reoperation, possible ostomy, hernia, heart attack, death, and other risks were discussed.  Goals of post-operative recovery were discussed as well.  We will  work to minimize complications.  Questions were answered.  The patient expresses understanding & wishes to proceed with surgery. ? ?OR FINDINGS: Long inflamed corkscrew appendix with phlegmon on distal third ? ?CASE DATA: ? ?Type of patient?: LDOW CASE (Surgical Hospitalist [facility] Inpatient) ? ?Status of Case? EMERGENT Add On ? ?Infection Present At Time Of Surgery (PATOS)?  PHLEGMON ? ?DESCRIPTION:  ? ?The patient was identified & brought into the operating room. The patient was positioned supine with arms tucked. SCDs were active during the entire case. The patient underwent general anesthesia without any difficulty.  The abdomen was prepped and draped in a sterile fashion. A Surgical Timeout confirmed our plan. ? ?I made a transverse incision through the superior umbilical fold.  I made a small transverse nick through the infraumbilical fascia and confirmed peritoneal entry.  I placed a 72mm port.  We induced carbon dioxide insufflation.  Camera inspection revealed no injury.  I placed additional ports under direct laparoscopic visualization.  Upon entering the abdomen (organ space), I encountered a phlegmon involving the appendix . ? ?I mobilized the terminal ileum to proximal ascending colon in a lateral to medial fashion.  I took care to avoid injuring any retroperitoneal structures.  I freed the appendix off its attachments to the ascending colon and cecal mesentery.  I elevated the appendix. I skeletonized the mesoappendix. I was able to free off the base of the appendix which was still viable.  I stapled the appendix off the cecum using a laparoscopic stapler. I took a healthy cuff of viable cecum. I ligated the mesoappendix and assured hemostasis in the mesentery.   I placed the appendix inside  an EcoSac bag and removed out the 72mm stapler port. ? ?I did copious irrigation. Hemostasis was good in the mesoappendix, colon mesentery, and retroperitoneum. Staple line was intact on the cecum with no bleeding. I  washed out the pelvis, retrohepatic space and right paracolic gutter. I washed out the left side as well.  Hemostasis is good. There was no perforation or injury. Because the area cleaned up well after irrigation, I did not place a drain.  I closed the 12 mm stapler port site fascia with #1 PDS suture using a suture passer under direct laparoscopic visualization.   I aspirated the carbon dioxide. Ports removed.  I closed skin using 4-0 monocryl stitch.  Sterile dressings applied. ? ?Patient was extubated and sent to the recovery room.   I suspect the patient is going used in the hospital at least overnight and will need antibiotics for 24 hours only.  I made an attempt to locate & reach the desired patient contact (patient's brother) to discuss the patient's overall status and my recommendations.  No one is available at this time.  My plans & instructions have been written in the chart. ? ?Adin Hector, M.D., F.A.C.S. ?Gastrointestinal and Minimally Invasive Surgery ?[facility] ?[address] ?[place], [place] [address] ?[phone] Main / Paging ? ?01/01/2022 ?12:48 PM ? ?

## 2022-01-01 NOTE — ED Notes (Signed)
[facility] at bedside 

## 2022-01-01 NOTE — Anesthesia Preprocedure Evaluation (Addendum)
Anesthesia Evaluation  ?Patient identified by MRN, date of birth, ID band ?Patient awake ? ? ? ?Reviewed: ?Allergy & Precautions, NPO status , Patient's Chart, lab work & pertinent test results ? ?Airway ?Mallampati: II ? ?TM Distance: >3 FB ?Neck ROM: Full ? ? ? Dental ? ?(+) Chipped,  ?  ?Pulmonary ?neg pulmonary ROS, Current Smoker and Patient abstained from smoking.,  ?  ?Pulmonary exam normal ? ? ? ? ? ? ? Cardiovascular ?negative cardio ROS ? ? ?Rhythm:Regular Rate:Normal ? ? ?  ?Neuro/Psych ?negative neurological ROS ? negative psych ROS  ? GI/Hepatic ?Neg liver ROS, Acute appendicitis  ?  ?Endo/Other  ?negative endocrine ROS ? Renal/GU ?negative Renal ROS  ?negative genitourinary ?  ?Musculoskeletal ?negative musculoskeletal ROS ?(+)  ? Abdominal ?Normal abdominal exam  (+)   ?Peds ? Hematology ?negative hematology ROS ?(+)   ?Anesthesia Other Findings ? ? Reproductive/Obstetrics ? ?  ? ? ? ? ? ? ? ? ? ? ? ? ? ?  ?  ? ? ? ? ? ? ?Anesthesia Physical ?Anesthesia Plan ? ?ASA: 2 ? ?Anesthesia Plan: General  ? ?Post-op Pain Management:   ? ?Induction: Intravenous ? ?PONV Risk Score and Plan: 1 and Ondansetron, Dexamethasone, Midazolam and Treatment may vary due to age or medical condition ? ?Airway Management Planned: Mask and Oral ETT ? ?Additional Equipment: None ? ?Intra-op Plan:  ? ?Post-operative Plan: Extubation in OR ? ?Informed Consent: I have reviewed the patients History and Physical, chart, labs and discussed the procedure including the risks, benefits and alternatives for the proposed anesthesia with the patient or authorized representative who has indicated his/her understanding and acceptance.  ? ? ? ?Dental advisory given and Interpreter used for interveiw ? ?Plan Discussed with:  ? ?Anesthesia Plan Comments: (Lab Results ?     Component                Value               Date                 ?     WBC                      20.7 (H)            12/31/2021           ?      HGB                      13.8                12/31/2021           ?     HCT                      39.7                12/31/2021           ?     MCV                      86.7                12/31/2021           ?     PLT  264                 12/31/2021           ?Lab Results ?     Component                Value               Date                 ?     NA                       138                 12/31/2021           ?     K                        3.7                 12/31/2021           ?     CO2                      21 (L)              12/31/2021           ?     GLUCOSE                  112 (H)             12/31/2021           ?     BUN                      13                  12/31/2021           ?     CREATININE               1.07                12/31/2021           ?     CALCIUM                  9.2                 12/31/2021           ?     GFRNONAA                 >60                 12/31/2021          )  ? ? ? ? ? ?Anesthesia Quick Evaluation ? ?

## 2022-01-01 NOTE — Anesthesia Procedure Notes (Signed)
Procedure Name: Intubation ?Date/Time: 01/01/2022 11:00 AM ?Performed by: [place] Toretto Tingler C, CRNA ?Pre-anesthesia Checklist: Patient identified, Emergency Drugs available, Suction available and Patient being monitored ?Patient Re-evaluated:Patient Re-evaluated prior to induction ?Oxygen Delivery Method: Circle system utilized ?Preoxygenation: Pre-oxygenation with 100% oxygen ?Induction Type: IV induction ?Ventilation: Mask ventilation without difficulty ?Laryngoscope Size: Mac and 4 ?Grade View: Grade I ?Tube type: Oral ?Tube size: 7.0 mm ?Number of attempts: 1 ?Airway Equipment and Method: Stylet and Oral airway ?Placement Confirmation: ETT inserted through vocal cords under direct vision, positive ETCO2 and breath sounds checked- equal and bilateral ?Secured at: 21 cm ?Tube secured with: Tape ?Dental Injury: Teeth and Oropharynx as per pre-operative assessment  ? ? ? ? ?

## 2022-01-01 NOTE — Anesthesia Postprocedure Evaluation (Signed)
Anesthesia Post Note ? ?Patient: Kirk Bailey ? ?Procedure(s) Performed: APPENDECTOMY LAPAROSCOPIC ? ?  ? ?Patient location during evaluation: PACU ?Anesthesia Type: General ?Level of consciousness: awake and alert ?Pain management: pain level controlled ?Vital Signs Assessment: post-procedure vital signs reviewed and stable ?Respiratory status: spontaneous breathing, nonlabored ventilation, respiratory function stable and patient connected to nasal cannula oxygen ?Cardiovascular status: blood pressure returned to baseline and stable ?Postop Assessment: no apparent nausea or vomiting ?Anesthetic complications: no ? ? ?No notable events documented. ? ?Last Vitals:  ?Vitals:  ? 01/01/22 1451 01/01/22 1551  ?BP: 109/77 108/71  ?Pulse: 83 76  ?Resp: 15 16  ?Temp: 36.4 ?C   ?SpO2: 96% 96%  ?  ?Last Pain:  ?Vitals:  ? 01/01/22 1451  ?TempSrc: Oral  ?PainSc:   ? ? ?  ?  ?  ?  ?  ?  ? ?[name] ? ? ? ? ?

## 2022-01-01 NOTE — Transfer of Care (Signed)
Immediate Anesthesia Transfer of Care Note ? ?Patient: Kirk Bailey ? ?Procedure(s) Performed: APPENDECTOMY LAPAROSCOPIC ? ?Patient Location: PACU ? ?Anesthesia Type:General ? ?Level of Consciousness: awake, alert  and oriented ? ?Airway & Oxygen Therapy: Patient Spontanous Breathing ? ?Post-op Assessment: Report given to RN and Post -op Vital signs reviewed and stable ? ?Post vital signs: Reviewed and stable ? ?Last Vitals:  ?Vitals Value Taken Time  ?BP 120/61 01/01/22 1233  ?Temp    ?Pulse 73 01/01/22 1236  ?Resp    ?SpO2 93 % 01/01/22 1236  ?Vitals shown include unvalidated device data. ? ?Last Pain:  ?Vitals:  ? 01/01/22 0816  ?TempSrc: Oral  ?PainSc:   ?   ? ?  ? ?Complications: No notable events documented. ?

## 2022-01-02 ENCOUNTER — Encounter (HOSPITAL_COMMUNITY): Payer: Self-pay | Admitting: Surgery

## 2022-01-02 LAB — SURGICAL PATHOLOGY

## 2022-01-02 LAB — URINE CULTURE: Culture: NO GROWTH

## 2022-01-07 ENCOUNTER — Emergency Department (HOSPITAL_BASED_OUTPATIENT_CLINIC_OR_DEPARTMENT_OTHER)
Admission: EM | Admit: 2022-01-07 | Discharge: 2022-01-07 | Disposition: A | Discharge status: Home / Self Care | Payer: Medicaid Other | Attending: Emergency Medicine | Admitting: Emergency Medicine

## 2022-01-07 ENCOUNTER — Encounter (HOSPITAL_BASED_OUTPATIENT_CLINIC_OR_DEPARTMENT_OTHER): Payer: Self-pay

## 2022-01-07 DIAGNOSIS — R1030 Lower abdominal pain, unspecified: Secondary | ICD-10-CM | POA: Insufficient documentation

## 2022-01-07 DIAGNOSIS — L905 Scar conditions and fibrosis of skin: Secondary | ICD-10-CM | POA: Insufficient documentation

## 2022-01-07 NOTE — ED Triage Notes (Addendum)
Arabic interpreter utilized Pt reports left lower abdominal pain and swelling since surgery on 01/01/22. Pt reports pain upon waking and laying down. Normal BM and no urinary issues. Pt called surgeon yesterday and said they would not given him answers. Follow up 5/ 12 ?

## 2022-01-07 NOTE — Discharge Instructions (Signed)
Everything with your surgery today looks normal.  It appears that you are healing well.  It is normal to have some change in your digestion after having the surgery but if you start having constant bloating, constant pain, nausea vomiting or any fever or inability to have a bowel movement you should return to the emergency room.  You can continue to take Tylenol as needed for the pain. ?

## 2022-01-07 NOTE — ED Notes (Signed)
Dc instructions reviewed with pt no questions or concerns at this time.  

## 2022-01-07 NOTE — ED Provider Notes (Signed)
?[facility] EMERGENCY DEPARTMENT ?Provider Note ? ? ?CSN: [id number] ?Arrival date & time: 01/07/22  1250 ? ?  ? ?History ? ?Chief Complaint  ?Patient presents with  ? Abdominal Pain  ? ? ?Kirk Bailey is a 31 y.o. male. ? ?Patient is a 31 year old male with recent appendectomy on 01/01/2022 who is returning today for recheck.  Patient reports since going home from the surgery he has had discomfort in his lower abdomen mostly on the left side that has seemed to be present when he is up and moving around but better when he lays still.  Also he has noticed sometimes at night his abdomen will feel distended and have some cramping pain.  He also reports that the left lower surgical scar sometimes burns and feels unusual.  He was not sure if this was normal and he wanted to have it checked that he is supposed to return to work on Thursday.  He has not had any nausea vomiting, urinary complaints or change in his bowel movements.  He has been having regular bowel movements reports at this time while sitting still he has no pain at all.  He has been eating and drinking regularly and using Tylenol as needed for pain. ? ?The history is provided by the patient. The history is limited by a language barrier. A language interpreter was used (Clinical research associate).  ?Abdominal Pain ? ?  ? ?Home Medications ?Prior to Admission medications   ?Medication Sig Start Date End Date Taking? Authorizing Provider  ?cyclobenzaprine (FLEXERIL) 5 MG tablet Take 1 tablet (5 mg total) by mouth at bedtime as needed for muscle spasms (Caution May Cause Drowsiness). ?Patient not taking: Reported on 01/01/2022 04/06/13   Cleora Fleet, MD  ?diclofenac sodium (VOLTAREN) 1 % GEL Apply 2 g topically 4 (four) times daily. ?Patient not taking: Reported on 01/01/2022 08/18/13   Quentin Angst, MD  ?naproxen (NAPROSYN) 500 MG tablet Arabic Instructions Please :  Take 1 tablet with food every 12 hours PRN back pain ?Patient not taking:  Reported on 01/01/2022 04/06/13   Cleora Fleet, MD  ?omeprazole (PRILOSEC) 40 MG capsule Take 40 mg by mouth daily. Got medicine from outside [place] ( [place])    [provider]  ?traMADol (ULTRAM) 50 MG tablet Take 1-2 tablets (50-100 mg total) by mouth every 6 (six) hours as needed for moderate pain or severe pain. 01/01/22   Karie Soda, MD  ?Ardelia Mems TO FIND LEFT WRIST SPLINT ?Patient not taking: Reported on 01/01/2022 07/14/13   Cathren Harsh, MD  ?   ? ?Allergies    ?Patient has no known allergies.   ? ?Review of Systems   ?Review of Systems  ?Gastrointestinal:  Positive for abdominal pain.  ? ?Physical Exam ?Updated Vital Signs ?BP (!) 133/91 (BP Location: Left Arm)   Pulse 66   Temp 97.9 ?F (36.6 ?C) (Oral)   Resp 18   Wt 93.4 kg   SpO2 98%   BMI 29.54 kg/m?  ?Physical Exam ?Vitals and nursing note reviewed.  ?Constitutional:   ?   General: He is not in acute distress. ?   Appearance: He is well-developed.  ?HENT:  ?   Head: Normocephalic and atraumatic.  ?Eyes:  ?   Conjunctiva/sclera: Conjunctivae normal.  ?   Pupils: Pupils are equal, round, and reactive to light.  ?Cardiovascular:  ?   Rate and Rhythm: Normal rate and regular rhythm.  ?   Heart sounds: No murmur heard. ?  Pulmonary:  ?   Effort: Pulmonary effort is normal. No respiratory distress.  ?   Breath sounds: Normal breath sounds. No wheezing or rales.  ?Abdominal:  ?   General: There is no distension.  ?   Palpations: Abdomen is soft.  ?   Tenderness: There is no abdominal tenderness. There is no guarding or rebound.  ?   Comments: Patient has well-healing surgical scars over the bilateral lower abdomen and umbilicus.  Ecchymosis is resolving and surgical incision sites are clean and intact without any erythema or drainage.  There is no significant abdominal tenderness with palpation.  No palpable induration or fluctuance.  ?Musculoskeletal:     ?   General: No tenderness. Normal range of motion.  ?   Cervical back: Normal range of  motion and neck supple.  ?Skin: ?   General: Skin is warm and dry.  ?   Findings: No erythema or rash.  ?Neurological:  ?   Mental Status: He is alert and oriented to person, place, and time.  ?Psychiatric:     ?   Behavior: Behavior normal.  ? ? ?ED Results / Procedures / Treatments   ?Labs ?(all labs ordered are listed, but only abnormal results are displayed) ?Labs Reviewed - No data to display ? ?EKG ?None ? ?Radiology ?No results found. ? ?Procedures ?Procedures  ? ? ?Medications Ordered in ED ?Medications - No data to display ? ?ED Course/ Medical Decision Making/ A&P ?  ?                        ?Medical Decision Making ? ?Patient here for recheck after having a laparoscopic appendectomy 6 days ago.  Patient reports that he has discomfort in his abdomen feels swollen mostly on the left side when he is up walking around it sometimes feels like it clicks and pulls.  This has been present since he got home from the hospital.  He does not feel that anything is gotten worse.  He has no symptoms consistent with abdominal abscess, obstruction or acute surgical complication.  Patient is well-appearing and his exam appears normal for a postsurgical abdomen at 6 days.  He has minimal to no tenderness with palpation in his surgical wounds appear to be healing well.  He has no urinary or bowel complaints at this time.  Reassured the patient, evaluated patient's recent surgical note and there were no complications during surgery.  Encouraged him to continue his current regime and to use Tylenol as needed as he has been doing.  Also encouraged him to follow-up with his surgical follow-up appointment on 12 May but to call them if he feels like anything worsens.  Patient was given return precautions such as vomiting, fever and inability to have a bowel movement.  At this time do not feel that patient's exam or symptoms suggest that he needs repeat imaging.  Patient discharged home in good condition. ? ? ? ? ? ? ? ?Final  Clinical Impression(s) / ED Diagnoses ?Final diagnoses:  ?Lower abdominal pain  ? ? ?Rx / DC Orders ?ED Discharge Orders   ? ? None  ? ?  ? ? ?  ?Gwyneth Sprout, MD ?01/07/22 1400 ? ?

## 2023-06-16 IMAGING — CT CT ABD-PELV W/ CM
2 of 4 series · 15 of 46 positions shown, 17 images · IV contrast (Omnipaque)
Comparison: None.

CLINICAL DATA: Abdominal pain and nausea since 2 a.m.,
constipation, difficulty urinating

EXAM:
CT ABDOMEN AND PELVIS WITH CONTRAST
TECHNIQUE: Multidetector CT imaging of the abdomen and pelvis was performed
using the standard protocol following bolus administration of
intravenous contrast.

[Series 2: axial st · axial · 0.94mm/px · z∈[-698,-238]mm · 12 of 102 slices shown, 14 images]
[im 5/102  soft-tissue]
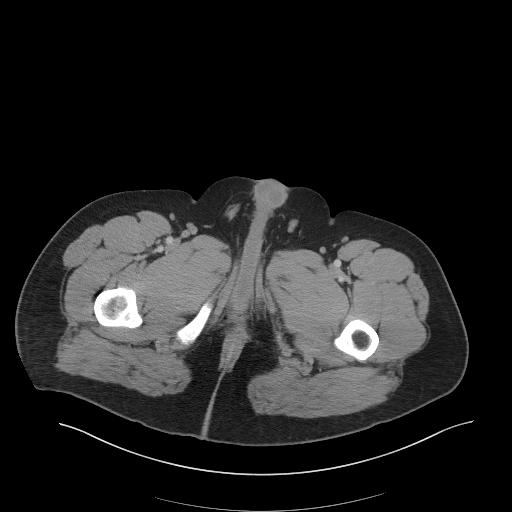
[im 5/102  bone]
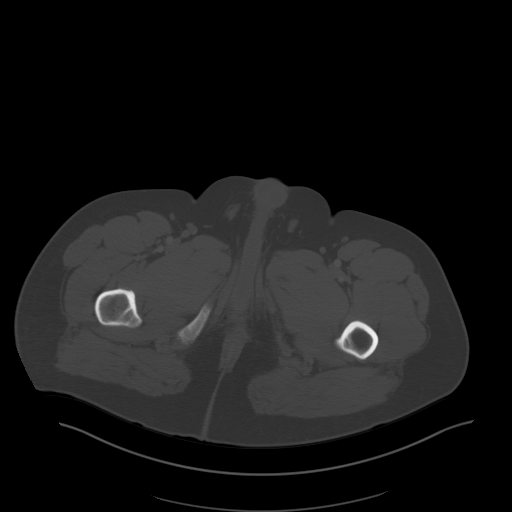
[im 14/102  soft-tissue]
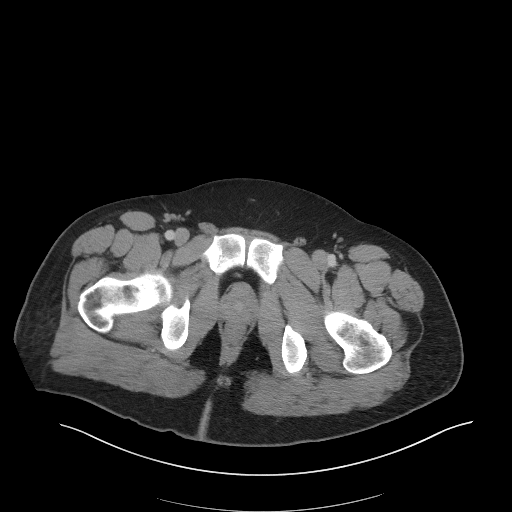
[im 22/102  soft-tissue]
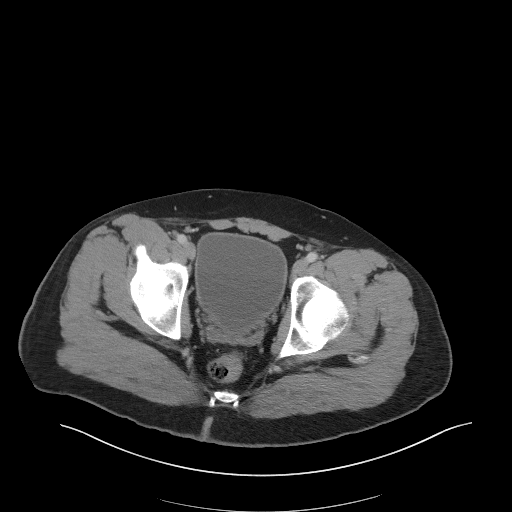
[im 31/102  soft-tissue]
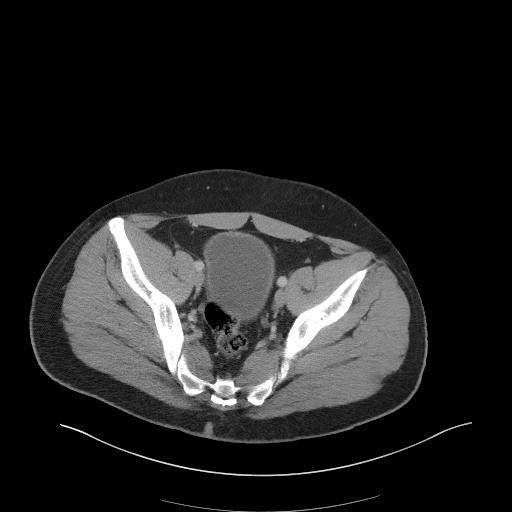
[im 40/102  soft-tissue]
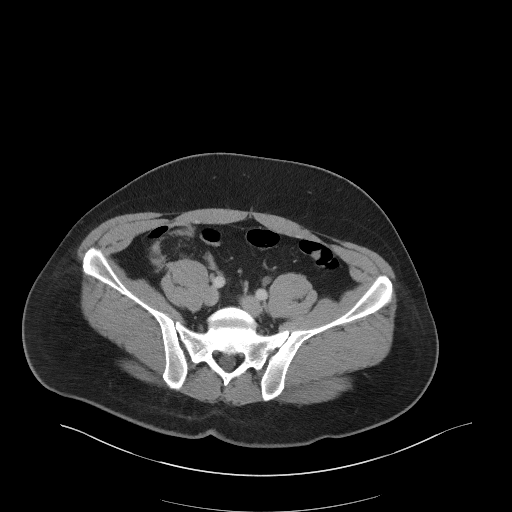
[im 49/102  soft-tissue]
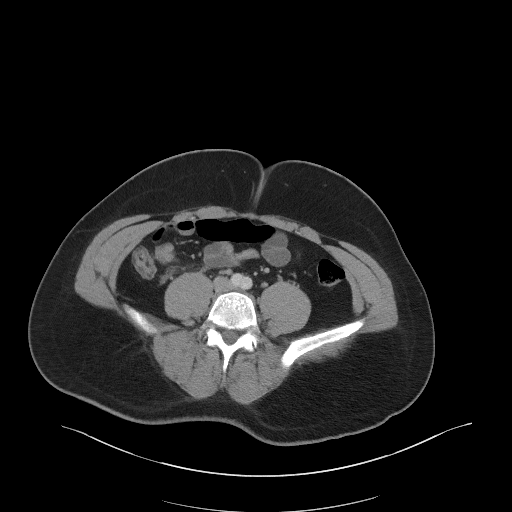
[im 53/102  soft-tissue]
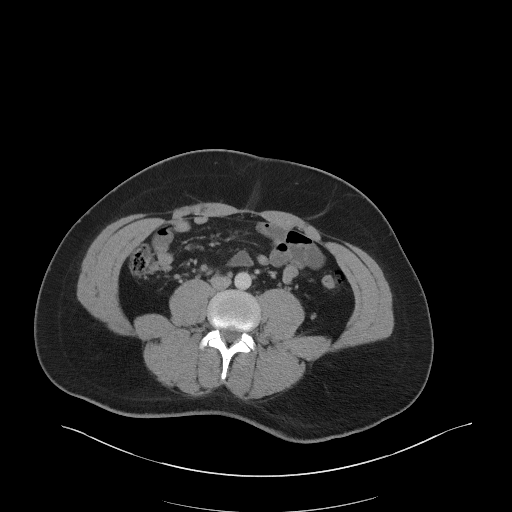
[im 62/102  soft-tissue]
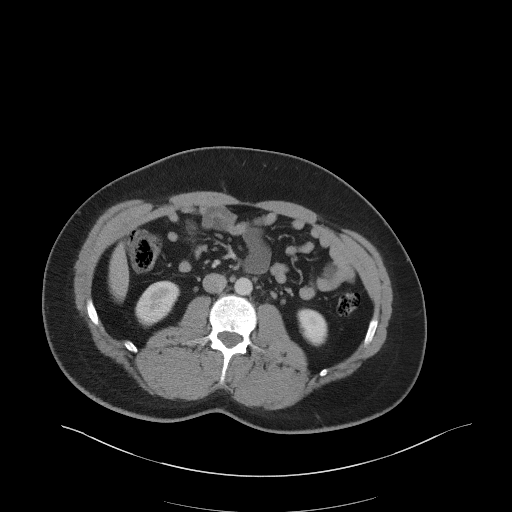
[im 71/102  soft-tissue]
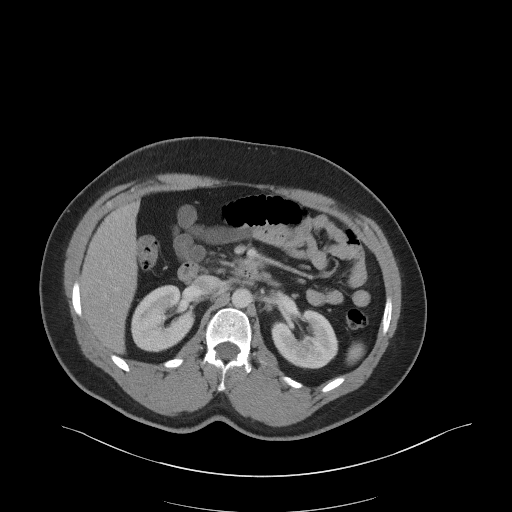
[im 71/102  bone]
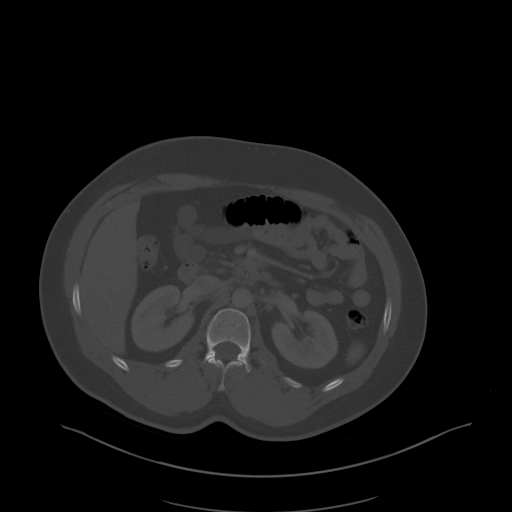
[im 80/102  soft-tissue]
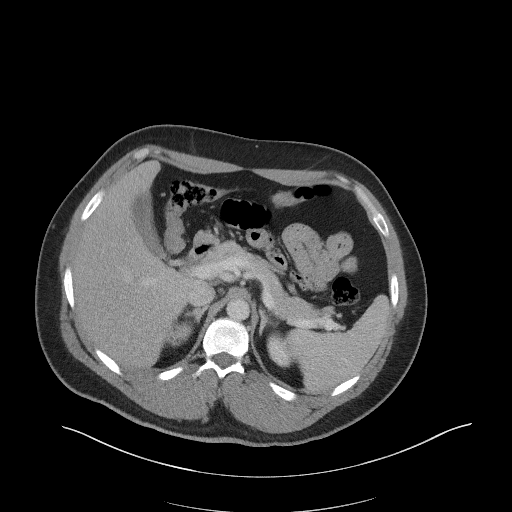
[im 88/102  soft-tissue]
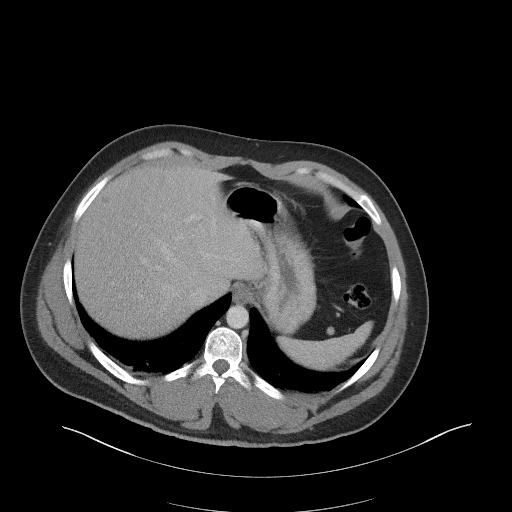
[im 97/102  soft-tissue]
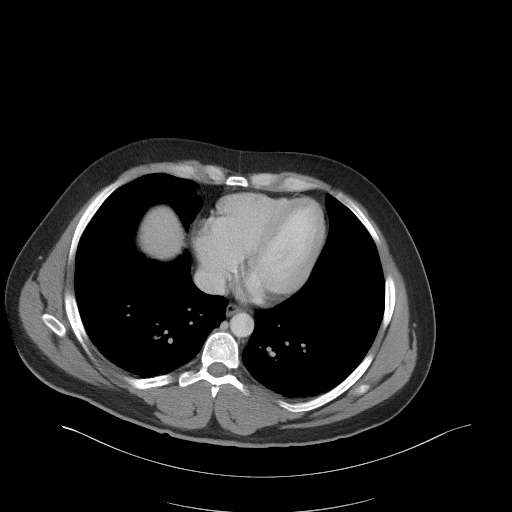

[Series 5: coronal st · coronal · 0.70mm/px · 3 of 99 slices shown]
[im 33/99  soft-tissue]
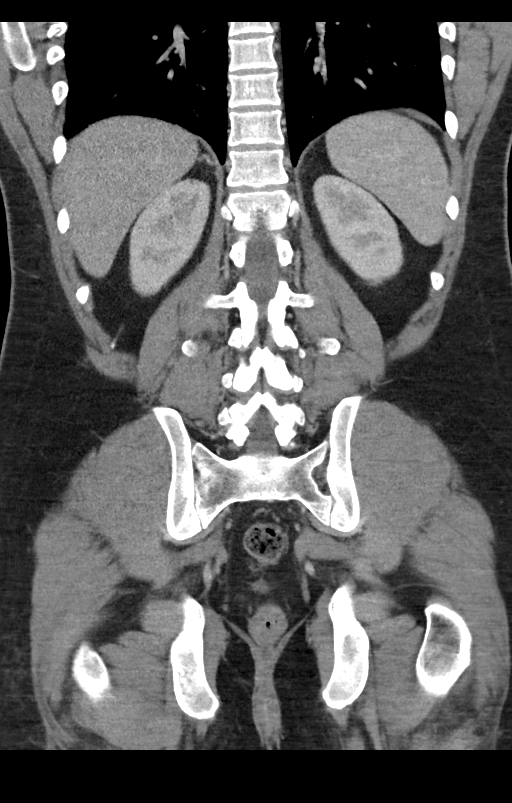
[im 44/99  soft-tissue]
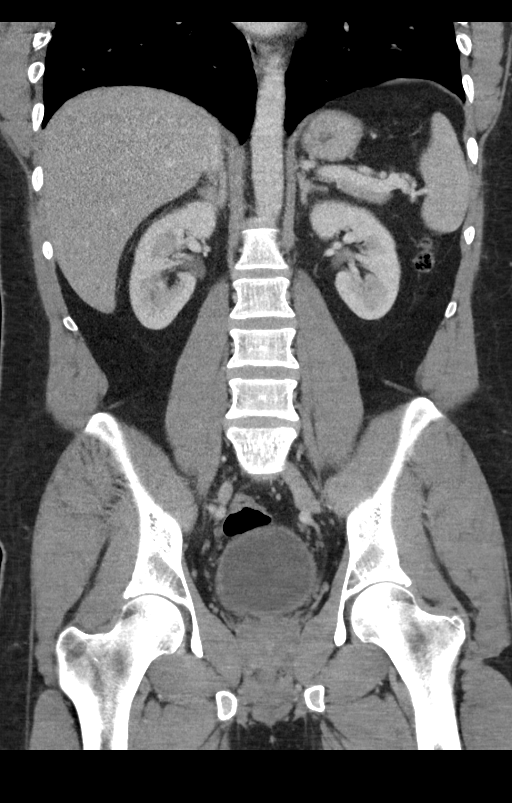
[im 55/99  soft-tissue]
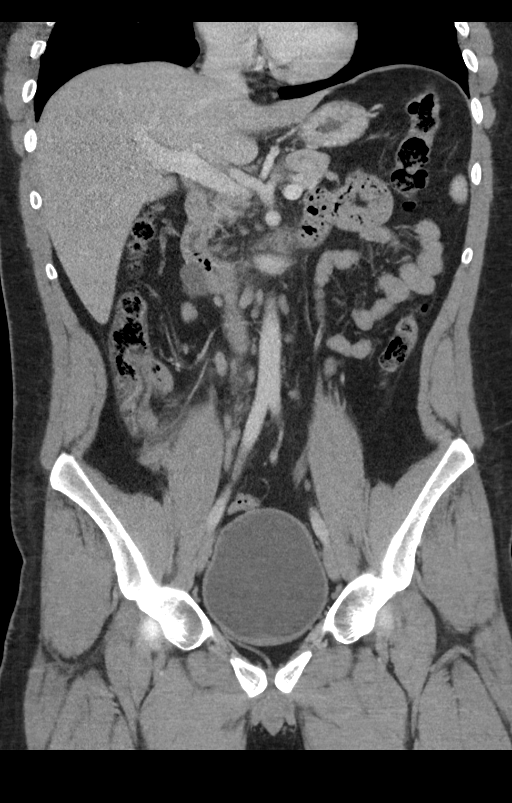

[15 of 46 positions shown; findings below may reference images not displayed]

RADIATION DOSE REDUCTION: This exam was performed according to the
departmental dose-optimization program which includes automated
exposure control, adjustment of the mA and/or kV according to
patient size and/or use of iterative reconstruction technique.

CONTRAST:  100mL OMNIPAQUE IOHEXOL 300 MG/ML  SOLN
FINDINGS: Lower chest: No acute pleural or parenchymal lung disease.

Hepatobiliary: Multiple subcentimeter hypodensities throughout the
liver likely reflect hepatic cysts. No biliary duct dilation. The
gallbladder is unremarkable.

Pancreas: Unremarkable. No pancreatic ductal dilatation or
surrounding inflammatory changes.

Spleen: Normal in size without focal abnormality.

Adrenals/Urinary Tract: Nonobstructing 4 mm left renal calculus. No
right-sided calculi. No obstructive uropathy within either kidney.
The adrenals are unremarkable.

Stomach/Bowel: The distal appendix is dilated measuring 13 mm, with
mural thickening and periappendiceal fat stranding consistent with
acute appendicitis. No evidence of perforation or abscess.

No evidence of bowel obstruction. Mildly distended gas-filled loops
of jejunum within the upper abdomen measuring 2.9 cm may reflect
regional ileus.

Vascular/Lymphatic: Multiple subcentimeter lymph nodes are seen
within the right lower quadrant mesentery, likely reactive. No
pathologic adenopathy. No significant vascular findings.

Reproductive: Prostate is unremarkable.

Other: Trace free fluid within the right lower quadrant. No fluid
collection or abscess. No free intraperitoneal gas. No abdominal
wall hernia.

Musculoskeletal: No acute or destructive bony lesions. Reconstructed
images demonstrate no additional findings.
IMPRESSION: 1. Acute uncomplicated appendicitis. No perforation, fluid
collection, or abscess.
2. Nonobstructing 4 mm left renal calculus.
3. Nonspecific distended gas-filled loops of jejunum within the
upper abdomen, which may reflect regional ileus. No bowel
obstruction.
